# Patient Record
Sex: Female | Born: 1970 | Race: Black or African American | Hispanic: No | Marital: Single | Smoking: Never smoker
Health system: Southern US, Community
[De-identification: ages and names within clinical notes are randomized; demographics above are authoritative.]

## PROBLEM LIST (undated history)

## (undated) DIAGNOSIS — D649 Anemia, unspecified: Secondary | ICD-10-CM

## (undated) DIAGNOSIS — E876 Hypokalemia: Secondary | ICD-10-CM

## (undated) DIAGNOSIS — K449 Diaphragmatic hernia without obstruction or gangrene: Secondary | ICD-10-CM

## (undated) DIAGNOSIS — I10 Essential (primary) hypertension: Secondary | ICD-10-CM

## (undated) DIAGNOSIS — K651 Peritoneal abscess: Secondary | ICD-10-CM

## (undated) DIAGNOSIS — K5792 Diverticulitis of intestine, part unspecified, without perforation or abscess without bleeding: Secondary | ICD-10-CM

## (undated) HISTORY — DX: Diverticulitis of intestine, part unspecified, without perforation or abscess without bleeding: K57.92

## (undated) HISTORY — DX: Diaphragmatic hernia without obstruction or gangrene: K44.9

## (undated) HISTORY — DX: Hypokalemia: E87.6

## (undated) HISTORY — DX: Anemia, unspecified: D64.9

## (undated) HISTORY — DX: Peritoneal abscess: K65.1

## (undated) HISTORY — DX: Morbid (severe) obesity due to excess calories: E66.01

---

## 2001-06-04 ENCOUNTER — Encounter: Payer: Self-pay | Admitting: *Deleted

## 2001-06-04 ENCOUNTER — Inpatient Hospital Stay (HOSPITAL_COMMUNITY): Admission: AD | Admit: 2001-06-04 | Discharge: 2001-06-04 | Payer: Self-pay | Admitting: *Deleted

## 2001-06-09 ENCOUNTER — Ambulatory Visit (HOSPITAL_COMMUNITY): Admission: RE | Admit: 2001-06-09 | Discharge: 2001-06-09 | Payer: Self-pay | Admitting: Obstetrics

## 2001-06-09 ENCOUNTER — Encounter: Admission: RE | Admit: 2001-06-09 | Discharge: 2001-06-09 | Payer: Self-pay | Admitting: Obstetrics

## 2001-06-09 ENCOUNTER — Encounter: Payer: Self-pay | Admitting: Obstetrics

## 2001-06-16 ENCOUNTER — Encounter: Admission: RE | Admit: 2001-06-16 | Discharge: 2001-06-16 | Payer: Self-pay | Admitting: Obstetrics

## 2001-06-30 ENCOUNTER — Encounter: Admission: RE | Admit: 2001-06-30 | Discharge: 2001-06-30 | Payer: Self-pay | Admitting: Obstetrics

## 2001-07-07 ENCOUNTER — Encounter: Admission: RE | Admit: 2001-07-07 | Discharge: 2001-07-07 | Payer: Self-pay | Admitting: Obstetrics

## 2001-07-25 ENCOUNTER — Encounter: Payer: Self-pay | Admitting: Obstetrics

## 2001-07-25 ENCOUNTER — Inpatient Hospital Stay (HOSPITAL_COMMUNITY): Admission: AD | Admit: 2001-07-25 | Discharge: 2001-07-25 | Payer: Self-pay

## 2001-08-02 ENCOUNTER — Ambulatory Visit (HOSPITAL_COMMUNITY): Admission: RE | Admit: 2001-08-02 | Discharge: 2001-08-02 | Payer: Self-pay | Admitting: *Deleted

## 2001-08-04 ENCOUNTER — Encounter: Admission: RE | Admit: 2001-08-04 | Discharge: 2001-08-04 | Payer: Self-pay | Admitting: Obstetrics

## 2001-08-18 ENCOUNTER — Encounter: Admission: RE | Admit: 2001-08-18 | Discharge: 2001-08-18 | Payer: Self-pay | Admitting: Obstetrics

## 2001-08-25 ENCOUNTER — Encounter: Admission: RE | Admit: 2001-08-25 | Discharge: 2001-08-25 | Payer: Self-pay | Admitting: Obstetrics

## 2001-09-01 ENCOUNTER — Encounter: Admission: RE | Admit: 2001-09-01 | Discharge: 2001-09-01 | Payer: Self-pay | Admitting: Obstetrics

## 2001-09-06 ENCOUNTER — Ambulatory Visit (HOSPITAL_COMMUNITY): Admission: RE | Admit: 2001-09-06 | Discharge: 2001-09-06 | Payer: Self-pay | Admitting: Obstetrics

## 2001-09-08 ENCOUNTER — Encounter: Admission: RE | Admit: 2001-09-08 | Discharge: 2001-09-08 | Payer: Self-pay | Admitting: Obstetrics

## 2001-09-22 ENCOUNTER — Encounter: Admission: RE | Admit: 2001-09-22 | Discharge: 2001-09-22 | Payer: Self-pay | Admitting: Obstetrics

## 2001-09-28 ENCOUNTER — Encounter: Admission: RE | Admit: 2001-09-28 | Discharge: 2001-09-28 | Payer: Self-pay | Admitting: *Deleted

## 2001-10-06 ENCOUNTER — Inpatient Hospital Stay (HOSPITAL_COMMUNITY): Admission: RE | Admit: 2001-10-06 | Discharge: 2001-10-06 | Payer: Self-pay | Admitting: *Deleted

## 2001-10-06 ENCOUNTER — Encounter: Admission: RE | Admit: 2001-10-06 | Discharge: 2001-10-06 | Payer: Self-pay | Admitting: *Deleted

## 2001-10-10 ENCOUNTER — Encounter: Payer: Self-pay | Admitting: *Deleted

## 2001-10-10 ENCOUNTER — Encounter (HOSPITAL_COMMUNITY): Admission: RE | Admit: 2001-10-10 | Discharge: 2001-10-24 | Payer: Self-pay | Admitting: *Deleted

## 2001-10-12 ENCOUNTER — Inpatient Hospital Stay (HOSPITAL_COMMUNITY): Admission: AD | Admit: 2001-10-12 | Discharge: 2001-10-12 | Payer: Self-pay | Admitting: *Deleted

## 2001-10-13 ENCOUNTER — Encounter: Admission: RE | Admit: 2001-10-13 | Discharge: 2001-10-13 | Payer: Self-pay | Admitting: *Deleted

## 2001-10-17 ENCOUNTER — Encounter: Payer: Self-pay | Admitting: *Deleted

## 2001-10-24 ENCOUNTER — Inpatient Hospital Stay (HOSPITAL_COMMUNITY): Admission: AD | Admit: 2001-10-24 | Discharge: 2001-10-28 | Payer: Self-pay | Admitting: *Deleted

## 2001-11-04 ENCOUNTER — Emergency Department (HOSPITAL_COMMUNITY): Admission: EM | Admit: 2001-11-04 | Discharge: 2001-11-04 | Payer: Self-pay | Admitting: Emergency Medicine

## 2003-12-04 ENCOUNTER — Ambulatory Visit (HOSPITAL_COMMUNITY): Admission: RE | Admit: 2003-12-04 | Discharge: 2003-12-04 | Payer: Self-pay | Admitting: Obstetrics and Gynecology

## 2004-01-01 ENCOUNTER — Ambulatory Visit (HOSPITAL_COMMUNITY): Admission: RE | Admit: 2004-01-01 | Discharge: 2004-01-01 | Payer: Self-pay | Admitting: Obstetrics and Gynecology

## 2004-01-16 ENCOUNTER — Encounter: Admission: RE | Admit: 2004-01-16 | Discharge: 2004-01-16 | Payer: Self-pay | Admitting: Obstetrics and Gynecology

## 2004-01-18 ENCOUNTER — Encounter: Admission: RE | Admit: 2004-01-18 | Discharge: 2004-01-18 | Payer: Self-pay | Admitting: Obstetrics and Gynecology

## 2004-01-23 ENCOUNTER — Encounter: Admission: RE | Admit: 2004-01-23 | Discharge: 2004-01-23 | Payer: Self-pay | Admitting: Obstetrics and Gynecology

## 2004-01-25 ENCOUNTER — Encounter: Admission: RE | Admit: 2004-01-25 | Discharge: 2004-01-25 | Payer: Self-pay | Admitting: Family Medicine

## 2004-01-29 ENCOUNTER — Encounter: Admission: RE | Admit: 2004-01-29 | Discharge: 2004-01-29 | Payer: Self-pay | Admitting: *Deleted

## 2004-01-29 ENCOUNTER — Ambulatory Visit (HOSPITAL_COMMUNITY): Admission: RE | Admit: 2004-01-29 | Discharge: 2004-01-29 | Payer: Self-pay | Admitting: Obstetrics and Gynecology

## 2004-02-01 ENCOUNTER — Encounter: Admission: RE | Admit: 2004-02-01 | Discharge: 2004-02-01 | Payer: Self-pay | Admitting: Obstetrics and Gynecology

## 2004-02-05 ENCOUNTER — Ambulatory Visit (HOSPITAL_COMMUNITY): Admission: RE | Admit: 2004-02-05 | Discharge: 2004-02-05 | Payer: Self-pay | Admitting: Obstetrics and Gynecology

## 2004-02-05 ENCOUNTER — Encounter: Admission: RE | Admit: 2004-02-05 | Discharge: 2004-02-05 | Payer: Self-pay | Admitting: Obstetrics and Gynecology

## 2004-02-08 ENCOUNTER — Encounter: Admission: RE | Admit: 2004-02-08 | Discharge: 2004-02-08 | Payer: Self-pay | Admitting: Obstetrics and Gynecology

## 2004-02-11 ENCOUNTER — Encounter: Admission: RE | Admit: 2004-02-11 | Discharge: 2004-02-11 | Payer: Self-pay | Admitting: Obstetrics and Gynecology

## 2004-02-15 ENCOUNTER — Encounter: Admission: RE | Admit: 2004-02-15 | Discharge: 2004-02-15 | Payer: Self-pay | Admitting: Obstetrics and Gynecology

## 2004-02-15 ENCOUNTER — Inpatient Hospital Stay (HOSPITAL_COMMUNITY): Admission: AD | Admit: 2004-02-15 | Discharge: 2004-02-15 | Payer: Self-pay | Admitting: Obstetrics and Gynecology

## 2004-02-16 ENCOUNTER — Inpatient Hospital Stay (HOSPITAL_COMMUNITY): Admission: AD | Admit: 2004-02-16 | Discharge: 2004-02-16 | Payer: Self-pay | Admitting: Obstetrics and Gynecology

## 2004-02-21 ENCOUNTER — Encounter: Payer: Self-pay | Admitting: Obstetrics and Gynecology

## 2004-02-21 ENCOUNTER — Inpatient Hospital Stay (HOSPITAL_COMMUNITY): Admission: AD | Admit: 2004-02-21 | Discharge: 2004-03-01 | Payer: Self-pay | Admitting: Obstetrics and Gynecology

## 2010-09-14 ENCOUNTER — Encounter: Payer: Self-pay | Admitting: Obstetrics and Gynecology

## 2015-10-14 ENCOUNTER — Encounter (HOSPITAL_COMMUNITY): Payer: Self-pay | Admitting: Emergency Medicine

## 2015-10-14 DIAGNOSIS — K5720 Diverticulitis of large intestine with perforation and abscess without bleeding: Principal | ICD-10-CM | POA: Diagnosis present

## 2015-10-14 DIAGNOSIS — I10 Essential (primary) hypertension: Secondary | ICD-10-CM | POA: Diagnosis present

## 2015-10-14 DIAGNOSIS — R079 Chest pain, unspecified: Secondary | ICD-10-CM | POA: Diagnosis present

## 2015-10-14 DIAGNOSIS — D509 Iron deficiency anemia, unspecified: Secondary | ICD-10-CM | POA: Diagnosis present

## 2015-10-14 DIAGNOSIS — K449 Diaphragmatic hernia without obstruction or gangrene: Secondary | ICD-10-CM | POA: Diagnosis present

## 2015-10-14 DIAGNOSIS — E876 Hypokalemia: Secondary | ICD-10-CM | POA: Diagnosis present

## 2015-10-14 DIAGNOSIS — E669 Obesity, unspecified: Secondary | ICD-10-CM | POA: Diagnosis present

## 2015-10-14 DIAGNOSIS — Z6841 Body Mass Index (BMI) 40.0 and over, adult: Secondary | ICD-10-CM

## 2015-10-14 DIAGNOSIS — K429 Umbilical hernia without obstruction or gangrene: Secondary | ICD-10-CM | POA: Diagnosis present

## 2015-10-14 DIAGNOSIS — Z880 Allergy status to penicillin: Secondary | ICD-10-CM

## 2015-10-14 DIAGNOSIS — R319 Hematuria, unspecified: Secondary | ICD-10-CM | POA: Diagnosis present

## 2015-10-14 DIAGNOSIS — K5900 Constipation, unspecified: Secondary | ICD-10-CM | POA: Diagnosis present

## 2015-10-14 LAB — CBC
HEMATOCRIT: 28.3 % — AB (ref 36.0–46.0)
HEMOGLOBIN: 8.8 g/dL — AB (ref 12.0–15.0)
MCH: 19.9 pg — ABNORMAL LOW (ref 26.0–34.0)
MCHC: 31.1 g/dL (ref 30.0–36.0)
MCV: 64 fL — AB (ref 78.0–100.0)
Platelets: 321 10*3/uL (ref 150–400)
RBC: 4.42 MIL/uL (ref 3.87–5.11)
RDW: 21.1 % — AB (ref 11.5–15.5)
WBC: 19.6 10*3/uL — ABNORMAL HIGH (ref 4.0–10.5)

## 2015-10-14 LAB — COMPREHENSIVE METABOLIC PANEL
ALBUMIN: 3.1 g/dL — AB (ref 3.5–5.0)
ALT: 13 U/L — ABNORMAL LOW (ref 14–54)
ANION GAP: 14 (ref 5–15)
AST: 15 U/L (ref 15–41)
Alkaline Phosphatase: 88 U/L (ref 38–126)
BUN: 8 mg/dL (ref 6–20)
CHLORIDE: 99 mmol/L — AB (ref 101–111)
CO2: 22 mmol/L (ref 22–32)
Calcium: 9.3 mg/dL (ref 8.9–10.3)
Creatinine, Ser: 0.99 mg/dL (ref 0.44–1.00)
GFR calc Af Amer: >60 mL/min (ref >60)
GFR calc non Af Amer: >60 mL/min (ref >60)
GLUCOSE: 130 mg/dL — AB (ref 65–99)
POTASSIUM: 3 mmol/L — AB (ref 3.5–5.1)
SODIUM: 135 mmol/L (ref 135–145)
Total Bilirubin: 0.5 mg/dL (ref 0.3–1.2)
Total Protein: 7.5 g/dL (ref 6.5–8.1)

## 2015-10-14 LAB — LIPASE, BLOOD: LIPASE: 38 U/L (ref 11–51)

## 2015-10-14 MED ORDER — ONDANSETRON 4 MG PO TBDP
ORAL_TABLET | ORAL | Status: AC
  Filled 2015-10-14: qty 1

## 2015-10-14 MED ORDER — IBUPROFEN 400 MG PO TABS
400.0000 mg | ORAL_TABLET | Freq: Once | ORAL | Status: AC
Start: 2015-10-14 — End: 2015-10-14
  Administered 2015-10-14: 400 mg via ORAL

## 2015-10-14 MED ORDER — ONDANSETRON 4 MG PO TBDP
4.0000 mg | ORAL_TABLET | Freq: Once | ORAL | Status: AC
  Administered 2015-10-14: 4 mg via ORAL

## 2015-10-14 MED ORDER — IBUPROFEN 400 MG PO TABS
ORAL_TABLET | ORAL | Status: AC
  Filled 2015-10-14: qty 1

## 2015-10-14 NOTE — ED Notes (Signed)
Pt from home for eval of constipation x2 weeks with abd pain, pt states took a laxative on Saturday with minimal relief. Pt denies any emesis but reports some lower back pain as well. Pt in nad noted. States abd pain gets worse with movement and touch.

## 2015-10-15 ENCOUNTER — Inpatient Hospital Stay (HOSPITAL_COMMUNITY)
Admission: EM | Admit: 2015-10-15 | Discharge: 2015-10-20 | DRG: 392 | Disposition: A | Discharge status: Home / Self Care | Payer: Medicaid Other | Attending: General Surgery | Admitting: General Surgery

## 2015-10-15 ENCOUNTER — Emergency Department (HOSPITAL_COMMUNITY): Payer: Medicaid Other

## 2015-10-15 ENCOUNTER — Encounter (HOSPITAL_COMMUNITY): Payer: Self-pay | Admitting: Radiology

## 2015-10-15 DIAGNOSIS — E669 Obesity, unspecified: Secondary | ICD-10-CM | POA: Diagnosis not present

## 2015-10-15 DIAGNOSIS — K5900 Constipation, unspecified: Secondary | ICD-10-CM | POA: Diagnosis present

## 2015-10-15 DIAGNOSIS — D649 Anemia, unspecified: Secondary | ICD-10-CM | POA: Diagnosis present

## 2015-10-15 DIAGNOSIS — K5720 Diverticulitis of large intestine with perforation and abscess without bleeding: Secondary | ICD-10-CM | POA: Diagnosis not present

## 2015-10-15 DIAGNOSIS — I10 Essential (primary) hypertension: Secondary | ICD-10-CM | POA: Diagnosis present

## 2015-10-15 DIAGNOSIS — Z880 Allergy status to penicillin: Secondary | ICD-10-CM | POA: Diagnosis not present

## 2015-10-15 DIAGNOSIS — E876 Hypokalemia: Secondary | ICD-10-CM | POA: Diagnosis present

## 2015-10-15 DIAGNOSIS — K429 Umbilical hernia without obstruction or gangrene: Secondary | ICD-10-CM | POA: Diagnosis present

## 2015-10-15 DIAGNOSIS — D509 Iron deficiency anemia, unspecified: Secondary | ICD-10-CM | POA: Diagnosis not present

## 2015-10-15 DIAGNOSIS — K449 Diaphragmatic hernia without obstruction or gangrene: Secondary | ICD-10-CM | POA: Diagnosis present

## 2015-10-15 DIAGNOSIS — R079 Chest pain, unspecified: Secondary | ICD-10-CM | POA: Diagnosis not present

## 2015-10-15 DIAGNOSIS — R319 Hematuria, unspecified: Secondary | ICD-10-CM | POA: Diagnosis present

## 2015-10-15 DIAGNOSIS — Z6841 Body Mass Index (BMI) 40.0 and over, adult: Secondary | ICD-10-CM | POA: Diagnosis not present

## 2015-10-15 DIAGNOSIS — K5732 Diverticulitis of large intestine without perforation or abscess without bleeding: Secondary | ICD-10-CM | POA: Diagnosis present

## 2015-10-15 HISTORY — DX: Essential (primary) hypertension: I10

## 2015-10-15 LAB — URINALYSIS, ROUTINE W REFLEX MICROSCOPIC
BILIRUBIN URINE: NEGATIVE
Glucose, UA: NEGATIVE mg/dL
KETONES UR: NEGATIVE mg/dL
Leukocytes, UA: NEGATIVE
NITRITE: NEGATIVE
PH: 6 (ref 5.0–8.0)
Protein, ur: NEGATIVE mg/dL
SPECIFIC GRAVITY, URINE: 1.017 (ref 1.005–1.030)

## 2015-10-15 LAB — URINE MICROSCOPIC-ADD ON: WBC, UA: NONE SEEN WBC/hpf (ref 0–5)

## 2015-10-15 LAB — PROTIME-INR
INR: 1.36 (ref 0.00–1.49)
Prothrombin Time: 16.9 seconds — ABNORMAL HIGH (ref 11.6–15.2)

## 2015-10-15 LAB — APTT: aPTT: 35 seconds (ref 24–37)

## 2015-10-15 LAB — POC URINE PREG, ED: Preg Test, Ur: NEGATIVE

## 2015-10-15 MED ORDER — MORPHINE SULFATE (PF) 4 MG/ML IV SOLN
4.0000 mg | Freq: Once | INTRAVENOUS | Status: AC
  Administered 2015-10-15: 4 mg via INTRAVENOUS
  Filled 2015-10-15: qty 1

## 2015-10-15 MED ORDER — ACETAMINOPHEN 325 MG PO TABS
650.0000 mg | ORAL_TABLET | Freq: Four times a day (QID) | ORAL | Status: DC | PRN
  Administered 2015-10-18: 650 mg via ORAL
  Filled 2015-10-15: qty 2

## 2015-10-15 MED ORDER — LABETALOL HCL 100 MG PO TABS
200.0000 mg | ORAL_TABLET | Freq: Two times a day (BID) | ORAL | Status: DC
  Administered 2015-10-15 – 2015-10-16 (×4): 200 mg via ORAL
  Filled 2015-10-15: qty 2
  Filled 2015-10-15 (×2): qty 1
  Filled 2015-10-15 (×2): qty 2
  Filled 2015-10-15: qty 1
  Filled 2015-10-15: qty 2

## 2015-10-15 MED ORDER — PIPERACILLIN-TAZOBACTAM 3.375 G IVPB
3.3750 g | Freq: Three times a day (TID) | INTRAVENOUS | Status: DC
  Administered 2015-10-15 – 2015-10-17 (×7): 3.375 g via INTRAVENOUS
  Filled 2015-10-15 (×8): qty 50

## 2015-10-15 MED ORDER — SODIUM CHLORIDE 0.9 % IV SOLN
1000.0000 mL | Freq: Once | INTRAVENOUS | Status: AC
  Administered 2015-10-15: 1000 mL via INTRAVENOUS

## 2015-10-15 MED ORDER — ACETAMINOPHEN 650 MG RE SUPP: 650.0000 mg | Freq: Four times a day (QID) | RECTAL | Status: DC | PRN

## 2015-10-15 MED ORDER — CIPROFLOXACIN IN D5W 400 MG/200ML IV SOLN: 400.0000 mg | Freq: Two times a day (BID) | INTRAVENOUS | Status: DC

## 2015-10-15 MED ORDER — CHLORHEXIDINE GLUCONATE 0.12 % MT SOLN
15.0000 mL | Freq: Two times a day (BID) | OROMUCOSAL | Status: DC
  Administered 2015-10-15 – 2015-10-17 (×5): 15 mL via OROMUCOSAL
  Filled 2015-10-15 (×5): qty 15

## 2015-10-15 MED ORDER — CIPROFLOXACIN IN D5W 400 MG/200ML IV SOLN
400.0000 mg | Freq: Once | INTRAVENOUS | Status: AC
  Administered 2015-10-15: 400 mg via INTRAVENOUS
  Filled 2015-10-15: qty 200

## 2015-10-15 MED ORDER — ONDANSETRON HCL 4 MG/2ML IJ SOLN
4.0000 mg | Freq: Once | INTRAMUSCULAR | Status: AC
  Administered 2015-10-15: 4 mg via INTRAVENOUS
  Filled 2015-10-15: qty 2

## 2015-10-15 MED ORDER — SODIUM CHLORIDE 0.9 % IV SOLN
1000.0000 mL | INTRAVENOUS | Status: DC
  Administered 2015-10-15 – 2015-10-16 (×2): 1000 mL via INTRAVENOUS

## 2015-10-15 MED ORDER — DIPHENHYDRAMINE HCL 50 MG/ML IJ SOLN: 25.0000 mg | Freq: Four times a day (QID) | INTRAMUSCULAR | Status: DC | PRN

## 2015-10-15 MED ORDER — HYDROMORPHONE HCL 1 MG/ML IJ SOLN
0.5000 mg | INTRAMUSCULAR | Status: DC | PRN
  Administered 2015-10-15 – 2015-10-16 (×4): 1 mg via INTRAVENOUS
  Filled 2015-10-15 (×4): qty 1

## 2015-10-15 MED ORDER — POTASSIUM CHLORIDE IN NACL 40-0.9 MEQ/L-% IV SOLN
INTRAVENOUS | Status: DC
  Administered 2015-10-15 – 2015-10-18 (×4): 100 mL/h via INTRAVENOUS
  Administered 2015-10-18 – 2015-10-20 (×3): 50 mL/h via INTRAVENOUS
  Filled 2015-10-15 (×14): qty 1000

## 2015-10-15 MED ORDER — ONDANSETRON 4 MG PO TBDP: 4.0000 mg | ORAL_TABLET | Freq: Four times a day (QID) | ORAL | Status: DC | PRN

## 2015-10-15 MED ORDER — POTASSIUM CHLORIDE 10 MEQ/100ML IV SOLN
10.0000 meq | Freq: Once | INTRAVENOUS | Status: AC
  Administered 2015-10-15: 10 meq via INTRAVENOUS
  Filled 2015-10-15: qty 100

## 2015-10-15 MED ORDER — CETYLPYRIDINIUM CHLORIDE 0.05 % MT LIQD
7.0000 mL | Freq: Two times a day (BID) | OROMUCOSAL | Status: DC
  Administered 2015-10-16 (×2): 7 mL via OROMUCOSAL

## 2015-10-15 MED ORDER — HYDRALAZINE HCL 20 MG/ML IJ SOLN
10.0000 mg | INTRAMUSCULAR | Status: DC | PRN
  Administered 2015-10-15 – 2015-10-16 (×2): 10 mg via INTRAVENOUS
  Filled 2015-10-15 (×2): qty 1

## 2015-10-15 MED ORDER — DIPHENHYDRAMINE HCL 25 MG PO CAPS: 25.0000 mg | ORAL_CAPSULE | Freq: Four times a day (QID) | ORAL | Status: DC | PRN

## 2015-10-15 MED ORDER — ONDANSETRON HCL 4 MG/2ML IJ SOLN
4.0000 mg | Freq: Four times a day (QID) | INTRAMUSCULAR | Status: DC | PRN
  Administered 2015-10-15 – 2015-10-17 (×3): 4 mg via INTRAVENOUS
  Filled 2015-10-15 (×3): qty 2

## 2015-10-15 MED ORDER — IOHEXOL 300 MG/ML  SOLN
100.0000 mL | Freq: Once | INTRAMUSCULAR | Status: AC | PRN
  Administered 2015-10-15: 100 mL via INTRAVENOUS

## 2015-10-15 MED ORDER — METRONIDAZOLE IN NACL 5-0.79 MG/ML-% IV SOLN: 500.0000 mg | Freq: Three times a day (TID) | INTRAVENOUS | Status: DC

## 2015-10-15 MED ORDER — OXYCODONE-ACETAMINOPHEN 5-325 MG PO TABS
1.0000 | ORAL_TABLET | ORAL | Status: DC | PRN
  Administered 2015-10-16 – 2015-10-17 (×3): 2 via ORAL
  Filled 2015-10-15 (×5): qty 2

## 2015-10-15 MED ORDER — METRONIDAZOLE IN NACL 5-0.79 MG/ML-% IV SOLN
500.0000 mg | Freq: Once | INTRAVENOUS | Status: AC
  Administered 2015-10-15: 500 mg via INTRAVENOUS
  Filled 2015-10-15: qty 100

## 2015-10-15 MED ORDER — ENOXAPARIN SODIUM 40 MG/0.4ML ~~LOC~~ SOLN
40.0000 mg | SUBCUTANEOUS | Status: DC
  Administered 2015-10-15 – 2015-10-19 (×5): 40 mg via SUBCUTANEOUS
  Filled 2015-10-15 (×7): qty 0.4

## 2015-10-15 NOTE — Progress Notes (Signed)
Patient ID: Bethany Houston, female   DOB: October 24, 1970, 45 y.o.   MRN: [id number]   Abdominal collection drain placement requested   Dr Loreta Ave has reviewed imaging and feels NO safe window for drain placement  Rec: re scan in several days after treatment---if abscess still apparent---may have window at that time  Will discuss with [facility]

## 2015-10-15 NOTE — ED Notes (Signed)
Dr. Glick at bedside.  

## 2015-10-15 NOTE — ED Notes (Signed)
Surgery at the bedside.

## 2015-10-15 NOTE — H&P (Signed)
Bethany Houston is an 45 y.o. female.   PCP: No primary care provider on file.  Chief Complaint:  Abdominal pain and constipation HPI: Pt presents with constipation that started last week, and then abdominal pain.  Laxative gave some relief, but 3 days ago had pain returned.  This was across her lower abdomen, and goes to her back.  Worse standing and pressure lower abdomen when lying down.  She has also had some nausea and vomiting on 2/19 & 2/20.  Pain was not getting better said it was like labor pain coming and going with no resolution.  Some relief after BM's.  She normally does not have issue with constipation.  Work up in the ED shows: admit temp 100.4, VSS.K+ 3.0, Glucose 130, WBC 19.6, and anemia with H/H 89.8/28, hematuria, with hx of recent heavy periods.  CT scan shows:  7.6 x 3.2 x 5.6 cm collection of fluid and air superior to the mid sigmoid colon, at the lower abdomen. Mild associated peripheral enhancement, compatible with evolving mesenteric abscess. This appears to reflect contained perforated diverticulitis, given underlying colonic wall thickening and diverticula. Given the location of this collection, it would be difficult to drain Percutaneously. Soft tissue inflammation seen extending superiorly along the mesentery. Tiny umbilical hernia, containing only fat.  Minimal scattered diverticulosis along the transverse and descending colon.  Tiny hiatal hernia noted.  We are ask to see.  Past Medical History  Diagnosis Date  . Hypertension     History reviewed. No pertinent past surgical history.  No family history on file. Social History:  reports that she has never smoked. She does not have any smokeless tobacco history on file. She reports that she does not drink alcohol. Her drug history is not on file.  Allergies:  Allergies  Allergen Reactions  . Penicillins Nausea And Vomiting    Prior to Admission medications   Medication Sig Start Date End Date Taking?  Authorizing Provider  labetalol (NORMODYNE) 200 MG tablet Take 200 mg by mouth 2 (two) times daily.   Yes Historical Provider, MD     Results for orders placed or performed during the hospital encounter of 10/15/15 (from the past 48 hour(s))  Lipase, blood     Status: None   Collection Time: 10/14/15  9:04 PM  Result Value Ref Range   Lipase 38 11 - 51 U/L  Comprehensive metabolic panel     Status: Abnormal   Collection Time: 10/14/15  9:04 PM  Result Value Ref Range   Sodium 135 135 - 145 mmol/L   Potassium 3.0 (L) 3.5 - 5.1 mmol/L   Chloride 99 (L) 101 - 111 mmol/L   CO2 22 22 - 32 mmol/L   Glucose, Bld 130 (H) 65 - 99 mg/dL   BUN 8 6 - 20 mg/dL   Creatinine, Ser 0.99 0.44 - 1.00 mg/dL   Calcium 9.3 8.9 - 10.3 mg/dL   Total Protein 7.5 6.5 - 8.1 g/dL   Albumin 3.1 (L) 3.5 - 5.0 g/dL   AST 15 15 - 41 U/L   ALT 13 (L) 14 - 54 U/L   Alkaline Phosphatase 88 38 - 126 U/L   Total Bilirubin 0.5 0.3 - 1.2 mg/dL   GFR calc non Af Amer >60 >60 mL/min   GFR calc Af Amer >60 >60 mL/min    Comment: (NOTE) The eGFR has been calculated using the CKD EPI equation. This calculation has not been validated in all clinical situations. eGFR's persistently <60  mL/min signify possible Chronic Kidney Disease.    Anion gap 14 5 - 15  CBC     Status: Abnormal   Collection Time: 10/14/15  9:04 PM  Result Value Ref Range   WBC 19.6 (H) 4.0 - 10.5 K/uL   RBC 4.42 3.87 - 5.11 MIL/uL   Hemoglobin 8.8 (L) 12.0 - 15.0 g/dL   HCT 28.3 (L) 36.0 - 46.0 %   MCV 64.0 (L) 78.0 - 100.0 fL   MCH 19.9 (L) 26.0 - 34.0 pg   MCHC 31.1 30.0 - 36.0 g/dL   RDW 21.1 (H) 11.5 - 15.5 %   Platelets 321 150 - 400 K/uL  Urinalysis, Routine w reflex microscopic (not at [facility])     Status: Abnormal   Collection Time: 10/15/15  4:35 AM  Result Value Ref Range   Color, Urine AMBER (A) YELLOW    Comment: BIOCHEMICALS MAY BE AFFECTED BY COLOR   APPearance CLOUDY (A) CLEAR   Specific Gravity, Urine 1.017 1.005 - 1.030    pH 6.0 5.0 - 8.0   Glucose, UA NEGATIVE NEGATIVE mg/dL   Hgb urine dipstick LARGE (A) NEGATIVE   Bilirubin Urine NEGATIVE NEGATIVE   Ketones, ur NEGATIVE NEGATIVE mg/dL   Protein, ur NEGATIVE NEGATIVE mg/dL   Nitrite NEGATIVE NEGATIVE   Leukocytes, UA NEGATIVE NEGATIVE  Urine microscopic-add on     Status: Abnormal   Collection Time: 10/15/15  4:35 AM  Result Value Ref Range   Squamous Epithelial / LPF 6-30 (A) NONE SEEN   WBC, UA NONE SEEN 0 - 5 WBC/hpf   RBC / HPF TOO NUMEROUS TO COUNT 0 - 5 RBC/hpf   Bacteria, UA FEW (A) NONE SEEN  POC urine preg, ED     Status: None   Collection Time: 10/15/15  4:49 AM  Result Value Ref Range   Preg Test, Ur NEGATIVE NEGATIVE    Comment:        THE SENSITIVITY OF THIS METHODOLOGY IS >24 mIU/mL    Ct Abdomen Pelvis W Contrast  10/15/2015  CLINICAL DATA:  Subacute onset of generalized abdominal pain and constipation. Lower back pain. Initial encounter. EXAM: CT ABDOMEN AND PELVIS WITH CONTRAST TECHNIQUE: Multidetector CT imaging of the abdomen and pelvis was performed using the standard protocol following bolus administration of intravenous contrast. CONTRAST:  163m OMNIPAQUE IOHEXOL 300 MG/ML  SOLN COMPARISON:  None. FINDINGS: The visualized lung bases are clear.  A tiny hiatal hernia is noted. The liver and spleen are unremarkable in appearance. The gallbladder is within normal limits. The pancreas and adrenal glands are unremarkable. The kidneys are unremarkable in appearance. There is no evidence of hydronephrosis. No renal or ureteral stones are seen. No perinephric stranding is appreciated. The small bowel is unremarkable in appearance. The stomach is within normal limits. No acute vascular abnormalities are seen. There is a 7.6 x 3.2 x 5.6 cm collection of fluid and air noted superior to the mid sigmoid colon, at the lower abdomen. Mild associated peripheral enhancement is seen, compatible with evolving abscess. This appears to be contained within  the mesentery. There is associated wall thickening along the mid sigmoid colon, with likely underlying diverticula. Findings are suspicious for contained perforated diverticulitis. Given the location of this collection, it would be difficult to drain percutaneously. Soft tissue inflammation is seen extending superiorly along the mesentery. The appendix is normal in caliber, without evidence of appendicitis. Minimal scattered diverticulosis is noted along the transverse and descending colon. A tiny umbilical hernia  is noted, containing only fat. The bladder is mildly distended and grossly remarkable. The uterus is grossly unremarkable in appearance. The ovaries are relatively symmetric. No suspicious adnexal masses are seen. No inguinal lymphadenopathy is seen. No acute osseous abnormalities are identified. IMPRESSION: 1. 7.6 x 3.2 x 5.6 cm collection of fluid and air superior to the mid sigmoid colon, at the lower abdomen. Mild associated peripheral enhancement, compatible with evolving mesenteric abscess. This appears to reflect contained perforated diverticulitis, given underlying colonic wall thickening and diverticula. Given the location of this collection, it would be difficult to drain percutaneously. 2. Soft tissue inflammation seen extending superiorly along the mesentery. 3. Tiny umbilical hernia, containing only fat. 4. Minimal scattered diverticulosis along the transverse and descending colon. 5. Tiny hiatal hernia noted. These results were called by telephone at the time of interpretation on 10/15/2015 at 6:24 am to Dr. Delora Fuel, who verbally acknowledged these results. Electronically Signed   By: Garald Balding M.D.   On: 10/15/2015 06:30    Review of Systems  Constitutional: Negative.   HENT: Negative.   Eyes: Negative.   Respiratory: Negative.   Cardiovascular: Negative.   Gastrointestinal: Positive for heartburn, nausea, vomiting, abdominal pain and constipation. Negative for diarrhea,  blood in stool and melena.  Genitourinary: Negative.        She is going thru Menopause, with no period since 05/2015.  Musculoskeletal: Negative.   Skin: Negative.   Neurological: Negative.   Endo/Heme/Allergies: Negative.   Psychiatric/Behavioral: Negative.     Blood pressure 153/84, pulse 82, temperature 98.4 F (36.9 C), temperature source Oral, resp. rate 17, SpO2 98 %. Physical Exam  Constitutional: She is oriented to person, place, and time. She appears well-developed and well-nourished. No distress.  66 inch Wt 394 BMI 63.6  HENT:  Head: Normocephalic and atraumatic.  Nose: Nose normal.  Eyes: Conjunctivae and EOM are normal. Right eye exhibits no discharge. Left eye exhibits no discharge. No scleral icterus.  Neck: Normal range of motion. Neck supple. No JVD present. No tracheal deviation present. No thyromegaly present.  Cardiovascular: Normal rate, regular rhythm, normal heart sounds and intact distal pulses.   No murmur heard. Respiratory: Effort normal and breath sounds normal. No respiratory distress. She has no wheezes. She has no rales. She exhibits no tenderness.  GI: Soft. Bowel sounds are normal. She exhibits no distension and no mass. There is tenderness (LLQ). There is no rebound and no guarding.  Musculoskeletal: She exhibits no edema.  Lymphadenopathy:    She has no cervical adenopathy.  Neurological: She is alert and oriented to person, place, and time. No cranial nerve deficit.  Skin: Skin is warm and dry. No rash noted. She is not diaphoretic. No erythema. No pallor.  Psychiatric: She has a normal mood and affect. Her behavior is normal. Judgment and thought content normal.     Assessment/Plan Acute sigmoid diverticulitis with  7.6 x 3.2 x 5.6 cm abscess Hypertension  BMI 63.3 Hypokalemia Anemia   Plan:  Admit, ask IR to review and be sure they cannot drain abscess, start her on Cipro/Flagyl secondary to penicillin allergy, hydrate, bowel rest.  I  will keep her on her BP medicine and see how she progresses.  Galaxy Borden, PA-C 10/15/2015, 7:41 AM

## 2015-10-15 NOTE — ED Notes (Signed)
Patient transported to CT 

## 2015-10-15 NOTE — ED Provider Notes (Signed)
CSN: [id number]     Arrival date & time 10/14/15  1953 History   First MD Initiated Contact with Patient 10/15/15 0410     Chief Complaint  Patient presents with  . Constipation  . Abdominal Pain     (Consider location/radiation/quality/duration/timing/severity/associated sxs/prior Treatment) Patient is a 45 y.o. female presenting with constipation and abdominal pain. The history is provided by the patient.  Constipation Associated symptoms: abdominal pain   Abdominal Pain Associated symptoms: constipation   She complains feeling of constipated for about the last week. She started having some epigastric pain about a week ago and thought she was constipated and took a laxative and had a bowel movement with resolution of pain. She was doing reasonably well until about 3 days ago when she had recurrence of pain. This time pain was across lower abdomen. She did take a laxative which gave slight relief but pain never went away. Pain does radiate to the back. When she stands, pain is worse in her back. When she lays down, she feels pressure across her lower abdomen. She does a better after passing some flatus. She has had nausea and vomiting and does feel better after vomiting. She denies fever or chills or sweats. She does have history of abdominal surgery with cesarean section about 10 years ago. She states that she has not had a menses in about 3 months and thinks she is going through menopause.  Past Medical History  Diagnosis Date  . Hypertension    History reviewed. No pertinent past surgical history. No family history on file. Social History  Substance Use Topics  . Smoking status: Never Smoker   . Smokeless tobacco: None  . Alcohol Use: No   OB History    No data available     Review of Systems  Gastrointestinal: Positive for abdominal pain and constipation.  All other systems reviewed and are negative.     Allergies  Penicillins  Home Medications   Prior to Admission  medications   Medication Sig Start Date End Date Taking? Authorizing Provider  labetalol (NORMODYNE) 200 MG tablet Take 200 mg by mouth 2 (two) times daily.   Yes Historical Provider, MD   BP 155/84 mmHg  Pulse 81  Temp(Src) 98.4 F (36.9 C) (Oral)  Resp 16  SpO2 98% Physical Exam  Nursing note and vitals reviewed.  Obese 45 year old female, resting comfortably and in no acute distress. Vital signs are significant for hypertension. Oxygen saturation is 98%, which is normal. Head is normocephalic and atraumatic. PERRLA, EOMI. Oropharynx is clear. Neck is nontender and supple without adenopathy or JVD. Back is nontender and there is no CVA tenderness. Lungs are clear without rales, wheezes, or rhonchi. Chest is nontender. Heart has regular rate and rhythm without murmur. Abdomen is soft, flat, with moderate tenderness across the lower abdomen. There is no rebound or guarding. There are no masses or hepatosplenomegaly and peristalsis is hypoactive. Extremities have no cyanosis or edema, full range of motion is present. Skin is warm and dry without rash. Neurologic: Mental status is normal, cranial nerves are intact, there are no motor or sensory deficits.  ED Course  Procedures (including critical care time) Labs Review Results for orders placed or performed during the hospital encounter of 10/15/15  Lipase, blood  Result Value Ref Range   Lipase 38 11 - 51 U/L  Comprehensive metabolic panel  Result Value Ref Range   Sodium 135 135 - 145 mmol/L   Potassium 3.0 (L)  3.5 - 5.1 mmol/L   Chloride 99 (L) 101 - 111 mmol/L   CO2 22 22 - 32 mmol/L   Glucose, Bld 130 (H) 65 - 99 mg/dL   BUN 8 6 - 20 mg/dL   Creatinine, Ser 1.61 0.44 - 1.00 mg/dL   Calcium 9.3 8.9 - 09.6 mg/dL   Total Protein 7.5 6.5 - 8.1 g/dL   Albumin 3.1 (L) 3.5 - 5.0 g/dL   AST 15 15 - 41 U/L   ALT 13 (L) 14 - 54 U/L   Alkaline Phosphatase 88 38 - 126 U/L   Total Bilirubin 0.5 0.3 - 1.2 mg/dL   GFR calc non Af  Amer >60 >60 mL/min   GFR calc Af Amer >60 >60 mL/min   Anion gap 14 5 - 15  CBC  Result Value Ref Range   WBC 19.6 (H) 4.0 - 10.5 K/uL   RBC 4.42 3.87 - 5.11 MIL/uL   Hemoglobin 8.8 (L) 12.0 - 15.0 g/dL   HCT 04.5 (L) 40.9 - 81.1 %   MCV 64.0 (L) 78.0 - 100.0 fL   MCH 19.9 (L) 26.0 - 34.0 pg   MCHC 31.1 30.0 - 36.0 g/dL   RDW 91.4 (H) 78.2 - 95.6 %   Platelets 321 150 - 400 K/uL  Urinalysis, Routine w reflex microscopic (not at [facility])  Result Value Ref Range   Color, Urine AMBER (A) YELLOW   APPearance CLOUDY (A) CLEAR   Specific Gravity, Urine 1.017 1.005 - 1.030   pH 6.0 5.0 - 8.0   Glucose, UA NEGATIVE NEGATIVE mg/dL   Hgb urine dipstick LARGE (A) NEGATIVE   Bilirubin Urine NEGATIVE NEGATIVE   Ketones, ur NEGATIVE NEGATIVE mg/dL   Protein, ur NEGATIVE NEGATIVE mg/dL   Nitrite NEGATIVE NEGATIVE   Leukocytes, UA NEGATIVE NEGATIVE  Urine microscopic-add on  Result Value Ref Range   Squamous Epithelial / LPF 6-30 (A) NONE SEEN   WBC, UA NONE SEEN 0 - 5 WBC/hpf   RBC / HPF TOO NUMEROUS TO COUNT 0 - 5 RBC/hpf   Bacteria, UA FEW (A) NONE SEEN  POC urine preg, ED  Result Value Ref Range   Preg Test, Ur NEGATIVE NEGATIVE   Imaging Review Ct Abdomen Pelvis W Contrast  10/15/2015  CLINICAL DATA:  Subacute onset of generalized abdominal pain and constipation. Lower back pain. Initial encounter. EXAM: CT ABDOMEN AND PELVIS WITH CONTRAST TECHNIQUE: Multidetector CT imaging of the abdomen and pelvis was performed using the standard protocol following bolus administration of intravenous contrast. CONTRAST:  OMNIPAQUE IOHEXOL 300 MG/ML  SOLN COMPARISON:  None. FINDINGS: The visualized lung bases are clear.  A tiny hiatal hernia is noted. The liver and spleen are unremarkable in appearance. The gallbladder is within normal limits. The pancreas and adrenal glands are unremarkable. The kidneys are unremarkable in appearance. There is no evidence of hydronephrosis. No renal or ureteral  stones are seen. No perinephric stranding is appreciated. The small bowel is unremarkable in appearance. The stomach is within normal limits. No acute vascular abnormalities are seen. There is a 7.6 x 3.2 x 5.6 cm collection of fluid and air noted superior to the mid sigmoid colon, at the lower abdomen. Mild associated peripheral enhancement is seen, compatible with evolving abscess. This appears to be contained within the mesentery. There is associated wall thickening along the mid sigmoid colon, with likely underlying diverticula. Findings are suspicious for contained perforated diverticulitis. Given the location of this collection, it would be difficult to  drain percutaneously. Soft tissue inflammation is seen extending superiorly along the mesentery. The appendix is normal in caliber, without evidence of appendicitis. Minimal scattered diverticulosis is noted along the transverse and descending colon. A tiny umbilical hernia is noted, containing only fat. The bladder is mildly distended and grossly remarkable. The uterus is grossly unremarkable in appearance. The ovaries are relatively symmetric. No suspicious adnexal masses are seen. No inguinal lymphadenopathy is seen. No acute osseous abnormalities are identified. IMPRESSION: 1. 7.6 x 3.2 x 5.6 cm collection of fluid and air superior to the mid sigmoid colon, at the lower abdomen. Mild associated peripheral enhancement, compatible with evolving mesenteric abscess. This appears to reflect contained perforated diverticulitis, given underlying colonic wall thickening and diverticula. Given the location of this collection, it would be difficult to drain percutaneously. 2. Soft tissue inflammation seen extending superiorly along the mesentery. 3. Tiny umbilical hernia, containing only fat. 4. Minimal scattered diverticulosis along the transverse and descending colon. 5. Tiny hiatal hernia noted. These results were called by telephone at the time of interpretation  on 10/15/2015 at 6:24 am to Dr. Dione Booze, who verbally acknowledged these results. Electronically Signed   By: Roanna Raider M.D.   On: 10/15/2015 06:30   I have personally reviewed and evaluated these images and lab results as part of my medical decision-making. I have personally discussed the findings with radiologist.   MDM   Final diagnoses:  Diverticulitis of large intestine with perforation and abscess without bleeding  Hypokalemia  Microcytic hypochromic anemia    Abdominal pain with subjective constipation. Significant elevation in WBC is noted. I'm concerned about possibility of diverticulitis and also small bowel obstruction. She will be sent for CT of abdomen and pelvis. Old records are reviewed confirming hospitalization for cesarean section.  CT scan shows perforated diverticulitis with abscess. She started on ciprofloxacin and metronidazole. With diverticular abscess, I discussed with the radiologist whether this would be amenable to percutaneous drainage and he felt it would be very difficult to approach percutaneously. Consultation will be obtained with internal medicine and general surgery to arrange hospital admission. Case is discussed with Dr. Janee Morn of general surgery agrees to admit the patient. Also, discussed with Willette Cluster of [facility] who agrees to see the patient in consultation.  Dione Booze, MD 10/15/15 [phone]

## 2015-10-15 NOTE — Progress Notes (Signed)
Dr. Dwain Sarna notified of patient's BP 194/99

## 2015-10-15 NOTE — Progress Notes (Signed)
Utilization Review completed.  Quade Ramirez RN CM  

## 2015-10-16 ENCOUNTER — Other Ambulatory Visit: Payer: Self-pay

## 2015-10-16 ENCOUNTER — Encounter (HOSPITAL_COMMUNITY): Payer: Self-pay | Admitting: Internal Medicine

## 2015-10-16 DIAGNOSIS — R079 Chest pain, unspecified: Secondary | ICD-10-CM | POA: Diagnosis not present

## 2015-10-16 DIAGNOSIS — K5720 Diverticulitis of large intestine with perforation and abscess without bleeding: Principal | ICD-10-CM

## 2015-10-16 DIAGNOSIS — D649 Anemia, unspecified: Secondary | ICD-10-CM | POA: Diagnosis present

## 2015-10-16 DIAGNOSIS — E876 Hypokalemia: Secondary | ICD-10-CM

## 2015-10-16 DIAGNOSIS — D509 Iron deficiency anemia, unspecified: Secondary | ICD-10-CM | POA: Insufficient documentation

## 2015-10-16 DIAGNOSIS — E669 Obesity, unspecified: Secondary | ICD-10-CM | POA: Diagnosis present

## 2015-10-16 DIAGNOSIS — I10 Essential (primary) hypertension: Secondary | ICD-10-CM | POA: Diagnosis present

## 2015-10-16 LAB — BASIC METABOLIC PANEL
Anion gap: 12 (ref 5–15)
BUN: 5 mg/dL — AB (ref 6–20)
CALCIUM: 8.7 mg/dL — AB (ref 8.9–10.3)
CHLORIDE: 106 mmol/L (ref 101–111)
CO2: 22 mmol/L (ref 22–32)
CREATININE: 0.89 mg/dL (ref 0.44–1.00)
GFR calc non Af Amer: >60 mL/min (ref >60)
Glucose, Bld: 105 mg/dL — ABNORMAL HIGH (ref 65–99)
Potassium: 3.5 mmol/L (ref 3.5–5.1)
Sodium: 140 mmol/L (ref 135–145)

## 2015-10-16 LAB — IRON AND TIBC
IRON: 18 ug/dL — AB (ref 28–170)
Saturation Ratios: 6 % — ABNORMAL LOW (ref 10.4–31.8)
TIBC: 302 ug/dL (ref 250–450)
UIBC: 284 ug/dL

## 2015-10-16 LAB — TROPONIN I: Troponin I: <0.03 ng/mL (ref <0.031)

## 2015-10-16 LAB — CBC
HCT: 26.4 % — ABNORMAL LOW (ref 36.0–46.0)
Hemoglobin: 8.4 g/dL — ABNORMAL LOW (ref 12.0–15.0)
MCH: 20.8 pg — AB (ref 26.0–34.0)
MCHC: 31.8 g/dL (ref 30.0–36.0)
MCV: 65.5 fL — AB (ref 78.0–100.0)
PLATELETS: 322 10*3/uL (ref 150–400)
RBC: 4.03 MIL/uL (ref 3.87–5.11)
RDW: 21.6 % — AB (ref 11.5–15.5)
WBC: 13.4 10*3/uL — ABNORMAL HIGH (ref 4.0–10.5)

## 2015-10-16 LAB — RETICULOCYTES
RBC.: 4.6 MIL/uL (ref 3.87–5.11)
RETIC COUNT ABSOLUTE: 27.6 10*3/uL (ref 19.0–186.0)
Retic Ct Pct: 0.6 % (ref 0.4–3.1)

## 2015-10-16 LAB — MAGNESIUM: MAGNESIUM: 2 mg/dL (ref 1.7–2.4)

## 2015-10-16 LAB — LIPID PANEL
Cholesterol: 159 mg/dL (ref 0–200)
HDL: 35 mg/dL — ABNORMAL LOW (ref >40)
LDL CALC: 106 mg/dL — AB (ref 0–99)
TRIGLYCERIDES: 88 mg/dL (ref <150)
Total CHOL/HDL Ratio: 4.5 RATIO
VLDL: 18 mg/dL (ref 0–40)

## 2015-10-16 LAB — VITAMIN B12: VITAMIN B 12: 584 pg/mL (ref 180–914)

## 2015-10-16 LAB — FOLATE: FOLATE: 22.1 ng/mL (ref >5.9)

## 2015-10-16 LAB — FERRITIN: Ferritin: 56 ng/mL (ref 11–307)

## 2015-10-16 MED ORDER — PROMETHAZINE HCL 25 MG/ML IJ SOLN
6.2500 mg | Freq: Four times a day (QID) | INTRAMUSCULAR | Status: DC | PRN
  Administered 2015-10-16 – 2015-10-17 (×3): 12.5 mg via INTRAVENOUS
  Filled 2015-10-16 (×4): qty 1

## 2015-10-16 MED ORDER — PROMETHAZINE HCL 25 MG/ML IJ SOLN: 25.0000 mg | Freq: Four times a day (QID) | INTRAMUSCULAR | Status: DC | PRN

## 2015-10-16 MED ORDER — HYDRALAZINE HCL 20 MG/ML IJ SOLN: 5.0000 mg | Freq: Four times a day (QID) | INTRAMUSCULAR | Status: DC | PRN

## 2015-10-16 MED ORDER — ALUM & MAG HYDROXIDE-SIMETH 200-200-20 MG/5ML PO SUSP
30.0000 mL | Freq: Once | ORAL | Status: DC
  Filled 2015-10-16 (×2): qty 30

## 2015-10-16 MED ORDER — GI COCKTAIL ~~LOC~~
30.0000 mL | Freq: Two times a day (BID) | ORAL | Status: DC | PRN
  Administered 2015-10-16: 30 mL via ORAL
  Filled 2015-10-16 (×2): qty 30

## 2015-10-16 MED ORDER — HYDRALAZINE HCL 20 MG/ML IJ SOLN
10.0000 mg | INTRAMUSCULAR | Status: DC | PRN
  Administered 2015-10-17: 10 mg via INTRAVENOUS
  Filled 2015-10-16: qty 1

## 2015-10-16 NOTE — Progress Notes (Signed)
Subjective: She felt pretty good earlier, now nauseated after taking a bath.  Abdominal pain is better.  Objective: Vital signs in last 24 hours: Temp:  [98 F (36.7 C)-99 F (37.2 C)] 98.4 F (36.9 C) (02/22 0532) Pulse Rate:  [61-82] 82 (02/22 0532) Resp:  [11-22] 18 (02/22 0532) BP: (158-204)/(71-105) 184/97 mmHg (02/22 0532) SpO2:  [96 %-100 %] 97 % (02/22 0532) Weight:  [129.457 kg (285 lb 6.4 oz)] 129.457 kg (285 lb 6.4 oz) (02/21 1712) Last BM Date: 10/16/15 NPO 1 BM this AM 900 urine recorded yesterday   Afebrile, VSS  BP up some CMP OK, WBC is down to 13.4 K H/H is down  But stable.  Intake/Output from previous day: 02/21 0701 - 02/22 0700 In: 3878.8 [I.V.:3878.8] Out: 900 [Urine:900] Intake/Output this shift: Total I/O In: -  Out: 1 [Stool:1]  General appearance: alert, cooperative and no distress Resp: clear to auscultation bilaterally GI: soft, less tender this AM, some nausea now.  Lab Results:   Recent Labs  10/14/15 2104 10/16/15 0614  WBC 19.6* 13.4*  HGB 8.8* 8.4*  HCT 28.3* 26.4*  PLT 321 322    BMET  Recent Labs  10/14/15 2104 10/16/15 0614  NA 135 140  K 3.0* 3.5  CL 99* 106  CO2 22 22  GLUCOSE 130* 105*  BUN 8 5*  CREATININE 0.99 0.89  CALCIUM 9.3 8.7*   PT/INR  Recent Labs  10/15/15 1017  LABPROT 16.9*  INR 1.36     Recent Labs Lab 10/14/15 2104  AST 15  ALT 13*  ALKPHOS 88  BILITOT 0.5  PROT 7.5  ALBUMIN 3.1*     Lipase     Component Value Date/Time   LIPASE 38 10/14/2015 2104     Studies/Results: Ct Abdomen Pelvis W Contrast  10/15/2015  CLINICAL DATA:  Subacute onset of generalized abdominal pain and constipation. Lower back pain. Initial encounter. EXAM: CT ABDOMEN AND PELVIS WITH CONTRAST TECHNIQUE: Multidetector CT imaging of the abdomen and pelvis was performed using the standard protocol following bolus administration of intravenous contrast. CONTRAST:  OMNIPAQUE IOHEXOL 300 MG/ML  SOLN  COMPARISON:  None. FINDINGS: The visualized lung bases are clear.  A tiny hiatal hernia is noted. The liver and spleen are unremarkable in appearance. The gallbladder is within normal limits. The pancreas and adrenal glands are unremarkable. The kidneys are unremarkable in appearance. There is no evidence of hydronephrosis. No renal or ureteral stones are seen. No perinephric stranding is appreciated. The small bowel is unremarkable in appearance. The stomach is within normal limits. No acute vascular abnormalities are seen. There is a 7.6 x 3.2 x 5.6 cm collection of fluid and air noted superior to the mid sigmoid colon, at the lower abdomen. Mild associated peripheral enhancement is seen, compatible with evolving abscess. This appears to be contained within the mesentery. There is associated wall thickening along the mid sigmoid colon, with likely underlying diverticula. Findings are suspicious for contained perforated diverticulitis. Given the location of this collection, it would be difficult to drain percutaneously. Soft tissue inflammation is seen extending superiorly along the mesentery. The appendix is normal in caliber, without evidence of appendicitis. Minimal scattered diverticulosis is noted along the transverse and descending colon. A tiny umbilical hernia is noted, containing only fat. The bladder is mildly distended and grossly remarkable. The uterus is grossly unremarkable in appearance. The ovaries are relatively symmetric. No suspicious adnexal masses are seen. No inguinal lymphadenopathy is seen. No acute osseous abnormalities  are identified. IMPRESSION: 1. 7.6 x 3.2 x 5.6 cm collection of fluid and air superior to the mid sigmoid colon, at the lower abdomen. Mild associated peripheral enhancement, compatible with evolving mesenteric abscess. This appears to reflect contained perforated diverticulitis, given underlying colonic wall thickening and diverticula. Given the location of this collection,  it would be difficult to drain percutaneously. 2. Soft tissue inflammation seen extending superiorly along the mesentery. 3. Tiny umbilical hernia, containing only fat. 4. Minimal scattered diverticulosis along the transverse and descending colon. 5. Tiny hiatal hernia noted. These results were called by telephone at the time of interpretation on 10/15/2015 at 6:24 am to Dr. Dione Booze, who verbally acknowledged these results. Electronically Signed   By: Roanna Raider M.D.   On: 10/15/2015 06:30    Medications: . antiseptic oral rinse  7 mL Mouth Rinse q12n4p  . chlorhexidine  15 mL Mouth Rinse BID  . enoxaparin (LOVENOX) injection  40 mg Subcutaneous Q24H  . labetalol  200 mg Oral BID  . piperacillin-tazobactam (ZOSYN)  IV  3.375 g Intravenous Q8H   . sodium chloride 1,000 mL (10/16/15 0140)  . 0.9 % NaCl with KCl 40 mEq / L 100 mL/hr (10/15/15 2356)    Assessment/Plan Acute sigmoid diverticulitis with 7.6 x 3.2 x 5.6 cm abscess Hypertension  BMI 63.3 Hypokalemia Anemia  Antibiotics:   Day 2 Zosyn DVT: Lovenox/SCD   Plan:  I will giver her some sips later after nausea is better, continue antibiotics and bowel rest, hydration.  Leave some PRN hydralazine for BP.  If that stay up we may have to consider second agent.  LOS: 1 day    JENNINGS,WILLARD 10/16/2015 Less pain Add Phenergan for nausea Sips only Patient examined and I agree with the assessment and plan  Violeta Gelinas, MD, MPH, FACS Trauma: [phone] General Surgery: [phone]  10/16/2015 12:50 PM

## 2015-10-16 NOTE — Progress Notes (Signed)
Patient complains of chest pain at 1450 Bethany Houston notified. BP at 1350 was 192/98 Hydralazine 10 mg iv given Bp at 1450 178/84 O2 sat 99% RA

## 2015-10-16 NOTE — Consult Note (Signed)
[facility] Medical Consultation  Bethany Houston ZOX:[id number] DOB: November 01, 1970 DOA: 10/15/2015 PCP: No primary care provider on file.   Requesting physician: will jennings Date of consultation: 10/16/2015 Reason for consultation: uncontrolled BP and chest pain  Impression/Recommendations Principal Problem:   Chest pain Active Problems:   Diverticulitis of colon with perforation   Hypertension   Obesity (BMI 30-39.9)   Anemia   Hypokalemia   #1. Chest pain. Typical and atypical features. Heart score 3. Resolved at time of consult. No sob or hypoxia.  May be related to gerd vs uncontrolled BP. -monitor on tele -cycle troponin -serial EKG -better BP control -pain management -gi cocktail -echo for assessment of LV function -lipid panel -depending on results of above, may benefit OP stress test  #2. Uncontrolled BP. Reports usually controlled with home labetalol but also states PCP referred her to cardiologist due to uncontrolled BP.  - continue home labetolol -hydralazine prn with slightly lower parameters -improved pain control  3. Obesity. BMI 46.2. -nutritional consult  #4. Anemia. Reports hx same. No obvious s/sx bleeding -FOBT -anemia panel -monitor.   #5 hypokalemia. Likely related to decreased po intake in setting of diverticulitis. Potassium level 3.0 on admission. Trending up with supplement in IV.  -check magnesium level -continue IV supplement -monitor   [facility] will followup again tomorrow. Please contact  can be of assistance in the meanwhile. Thank you for this consultation.  Chief Complaint: chest pain   HPI: 45 yo hx htn, obesity admitted yesterday on general surgery service for abdominal pain. Evaluation revealed acute sigmoind diverticulitis with abscess. Not able to drain. IV antibiotics started. Complained nausea today inspite of zofran. Was given phenergan and shortly thereafter developed epigastric "ache". Denies radiation of pain, denies  diaphoresis, sob. Pain lasted less 5 minutes. Resolved at time of my exame   Review of Systems:  10 point review of systems complete all negative except as noted in HPI  Past Medical History  Diagnosis Date  . Hypertension   . Obesity (BMI 30-39.9)    History reviewed. No pertinent past surgical history. Social History:  reports that she has never smoked. She does not have any smokeless tobacco history on file. She reports that she does not drink alcohol. Her drug history is not on file.  Allergies  Allergen Reactions  . Penicillins Nausea And Vomiting   No family history on file.family medical hx positive for htn, diabetes  Prior to Admission medications   Medication Sig Start Date End Date Taking? Authorizing Provider  labetalol (NORMODYNE) 200 MG tablet Take 200 mg by mouth 2 (two) times daily.   Yes Historical Provider, MD   Physical Exam: Blood pressure 178/84, pulse 102, temperature 98.3 F (36.8 C), temperature source Oral, resp. rate 18, height  (1.676 m), weight 129.457 kg (285 lb 6.4 oz), SpO2 99 %. Filed Vitals:   10/16/15 1353 10/16/15 1449  BP: 192/98 178/84  Pulse: 68 102  Temp: 98.3 F (36.8 C)   Resp: 18      General:  Obese, appears comfortable  Eyes: PERRL EOMI no scleral icterus  ENT: ears clear nose without drainage oropharnyx without erythema or exudate  Neck: no JVD full rom  Cardiovascular: RRR no MGR trace LE edema  Respiratory: normal effort BS distant but clear no wheeze  Abdomen: obese soft +BS mild diffuse tenderness  Skin: warm and dry no lesions  Musculoskeletal: joints without swelling/erythems  Psychiatric: calm cooperative  Neurologic: speech clear facial symmetry  Labs on  Admission:  Basic Metabolic Panel:  Recent Labs Lab 10/14/15 2104 10/16/15 0614  NA 135 140  K 3.0* 3.5  CL 99* 106  CO2 22 22  GLUCOSE 130* 105*  BUN 8 5*  CREATININE 0.99 0.89  CALCIUM 9.3 8.7*   Liver Function Tests:  Recent  Labs Lab 10/14/15 2104  AST 15  ALT 13*  ALKPHOS 88  BILITOT 0.5  PROT 7.5  ALBUMIN 3.1*    Recent Labs Lab 10/14/15 2104  LIPASE 38   No results for input(s): AMMONIA in the last 168 hours. CBC:  Recent Labs Lab 10/14/15 2104 10/16/15 0614  WBC 19.6* 13.4*  HGB 8.8* 8.4*  HCT 28.3* 26.4*  MCV 64.0* 65.5*  PLT 321 322   Cardiac Enzymes: No results for input(s): CKTOTAL, CKMB, CKMBINDEX, TROPONINI in the last 168 hours. BNP: Invalid input(s): POCBNP CBG: No results for input(s): GLUCAP in the last 168 hours.  Radiological Exams on Admission: Ct Abdomen Pelvis W Contrast  10/15/2015  CLINICAL DATA:  Subacute onset of generalized abdominal pain and constipation. Lower back pain. Initial encounter. EXAM: CT ABDOMEN AND PELVIS WITH CONTRAST TECHNIQUE: Multidetector CT imaging of the abdomen and pelvis was performed using the standard protocol following bolus administration of intravenous contrast. CONTRAST:  OMNIPAQUE IOHEXOL 300 MG/ML  SOLN COMPARISON:  None. FINDINGS: The visualized lung bases are clear.  A tiny hiatal hernia is noted. The liver and spleen are unremarkable in appearance. The gallbladder is within normal limits. The pancreas and adrenal glands are unremarkable. The kidneys are unremarkable in appearance. There is no evidence of hydronephrosis. No renal or ureteral stones are seen. No perinephric stranding is appreciated. The small bowel is unremarkable in appearance. The stomach is within normal limits. No acute vascular abnormalities are seen. There is a 7.6 x 3.2 x 5.6 cm collection of fluid and air noted superior to the mid sigmoid colon, at the lower abdomen. Mild associated peripheral enhancement is seen, compatible with evolving abscess. This appears to be contained within the mesentery. There is associated wall thickening along the mid sigmoid colon, with likely underlying diverticula. Findings are suspicious for contained perforated diverticulitis.  Given the location of this collection, it would be difficult to drain percutaneously. Soft tissue inflammation is seen extending superiorly along the mesentery. The appendix is normal in caliber, without evidence of appendicitis. Minimal scattered diverticulosis is noted along the transverse and descending colon. A tiny umbilical hernia is noted, containing only fat. The bladder is mildly distended and grossly remarkable. The uterus is grossly unremarkable in appearance. The ovaries are relatively symmetric. No suspicious adnexal masses are seen. No inguinal lymphadenopathy is seen. No acute osseous abnormalities are identified. IMPRESSION: 1. 7.6 x 3.2 x 5.6 cm collection of fluid and air superior to the mid sigmoid colon, at the lower abdomen. Mild associated peripheral enhancement, compatible with evolving mesenteric abscess. This appears to reflect contained perforated diverticulitis, given underlying colonic wall thickening and diverticula. Given the location of this collection, it would be difficult to drain percutaneously. 2. Soft tissue inflammation seen extending superiorly along the mesentery. 3. Tiny umbilical hernia, containing only fat. 4. Minimal scattered diverticulosis along the transverse and descending colon. 5. Tiny hiatal hernia noted. These results were called by telephone at the time of interpretation on 10/15/2015 at 6:24 am to Dr. Dione Booze, who verbally acknowledged these results. Electronically Signed   By: Roanna Raider M.D.   On: 10/15/2015 06:30    EKG: Independently reviewed NSR  Time  spent: 55 minutes  [facility]   If 7PM-7AM, please contact night-coverage [email or web address] Password [facility] 10/16/2015, 4:03 PM

## 2015-10-16 NOTE — Progress Notes (Signed)
Called by nurse, she was having nausea unrelieved by Zofran and we ordered phenergan for her .  Then I was called with chest pain.  Pt notes it is mid upper sternum.  She doesn't think it is GERD symptoms.  She never had chest pain before.  Pain called 10/10, no SOB with it.  Pain is better now but still having some.   Exam: alert, does not appear in acute distress. Chest: clear Cardiac:  Tachycardic, no M or R. BP is better but that is with Hydralazine Imp:  1. New chest pain, rule out GI vs cardiac source.  2.  Poorly controlled hypertension  Plan:  Get EKG, ask Medicine to see and help chest pain and BP which has been poorly controlled here.  EKG, monitor, troponin's x 3, Mylanta.  Lytes were normal as was the CBC this AM.

## 2015-10-17 ENCOUNTER — Ambulatory Visit (HOSPITAL_COMMUNITY): Payer: Medicaid Other

## 2015-10-17 DIAGNOSIS — R079 Chest pain, unspecified: Secondary | ICD-10-CM

## 2015-10-17 DIAGNOSIS — D649 Anemia, unspecified: Secondary | ICD-10-CM

## 2015-10-17 DIAGNOSIS — I10 Essential (primary) hypertension: Secondary | ICD-10-CM

## 2015-10-17 LAB — BASIC METABOLIC PANEL
Anion gap: 9 (ref 5–15)
BUN: <5 mg/dL — ABNORMAL LOW (ref 6–20)
CHLORIDE: 108 mmol/L (ref 101–111)
CO2: 22 mmol/L (ref 22–32)
Calcium: 8.9 mg/dL (ref 8.9–10.3)
Creatinine, Ser: 0.94 mg/dL (ref 0.44–1.00)
GFR calc non Af Amer: >60 mL/min (ref >60)
Glucose, Bld: 100 mg/dL — ABNORMAL HIGH (ref 65–99)
POTASSIUM: 4 mmol/L (ref 3.5–5.1)
SODIUM: 139 mmol/L (ref 135–145)

## 2015-10-17 LAB — CBC
HEMATOCRIT: 27.7 % — AB (ref 36.0–46.0)
Hemoglobin: 8.5 g/dL — ABNORMAL LOW (ref 12.0–15.0)
MCH: 19.9 pg — ABNORMAL LOW (ref 26.0–34.0)
MCHC: 30.7 g/dL (ref 30.0–36.0)
MCV: 64.9 fL — AB (ref 78.0–100.0)
Platelets: 349 10*3/uL (ref 150–400)
RBC: 4.27 MIL/uL (ref 3.87–5.11)
RDW: 21.6 % — ABNORMAL HIGH (ref 11.5–15.5)
WBC: 9.6 10*3/uL (ref 4.0–10.5)

## 2015-10-17 LAB — TROPONIN I: Troponin I: <0.03 ng/mL (ref <0.031)

## 2015-10-17 MED ORDER — PANTOPRAZOLE SODIUM 40 MG IV SOLR
40.0000 mg | INTRAVENOUS | Status: DC
  Administered 2015-10-17 – 2015-10-20 (×4): 40 mg via INTRAVENOUS
  Filled 2015-10-17 (×5): qty 40

## 2015-10-17 MED ORDER — LISINOPRIL 10 MG PO TABS
10.0000 mg | ORAL_TABLET | Freq: Every day | ORAL | Status: DC
  Administered 2015-10-17 – 2015-10-18 (×2): 10 mg via ORAL
  Filled 2015-10-17 (×2): qty 1

## 2015-10-17 MED ORDER — ACETAMINOPHEN 10 MG/ML IV SOLN
1000.0000 mg | Freq: Once | INTRAVENOUS | Status: AC
  Administered 2015-10-17: 1000 mg via INTRAVENOUS
  Filled 2015-10-17: qty 100

## 2015-10-17 MED ORDER — METOPROLOL TARTRATE 1 MG/ML IV SOLN
2.5000 mg | INTRAVENOUS | Status: DC | PRN
  Administered 2015-10-17 – 2015-10-20 (×5): 2.5 mg via INTRAVENOUS
  Filled 2015-10-17 (×4): qty 5

## 2015-10-17 MED ORDER — LABETALOL HCL 100 MG PO TABS
300.0000 mg | ORAL_TABLET | Freq: Two times a day (BID) | ORAL | Status: DC
  Administered 2015-10-17 – 2015-10-20 (×7): 300 mg via ORAL
  Filled 2015-10-17 (×7): qty 3

## 2015-10-17 MED ORDER — PROCHLORPERAZINE EDISYLATE 5 MG/ML IJ SOLN
10.0000 mg | Freq: Four times a day (QID) | INTRAMUSCULAR | Status: DC | PRN
  Administered 2015-10-17: 10 mg via INTRAVENOUS
  Filled 2015-10-17 (×2): qty 2

## 2015-10-17 MED ORDER — SODIUM CHLORIDE 0.9 % IV SOLN
1.0000 g | INTRAVENOUS | Status: DC
  Administered 2015-10-17 – 2015-10-19 (×3): 1 g via INTRAVENOUS
  Filled 2015-10-17 (×4): qty 1

## 2015-10-17 MED ORDER — METOPROLOL TARTRATE 1 MG/ML IV SOLN: 5.0000 mg | INTRAVENOUS | Status: DC | PRN

## 2015-10-17 NOTE — Progress Notes (Signed)
PATIENT DETAILS Name: Bethany Houston Age: 45 y.o. Sex: female Date of Birth: [birth year] Admit Date: 10/15/2015 Admitting Physician Ozella Rocks, MD PCP:No primary care provider on file.  Subjective: No further chest pain. Very minimal left lower quadrant pain.  Assessment/Plan: Chest pain: Resolved Likely atypical. EKG/troponins negative. Check echo-but doubt if any inpatient workup is needed.  Active Problems: Hypertension: suspect worsened by left lower quadrant pain/IV fluids. Increase labetalol to 300 mg twice a day. Follow, if needed we can add a second agent over the next few days.  Microcytic anemia: Iron panel surprisingly shows a normal ferritin level. Hemoglobin is stable, suspect this is stable for outpatient monitoring.   Diverticular abscess: Per primary team  Disposition: Per primary team  Antimicrobial agents  See below  Anti-infectives    Start     Dose/Rate Route Frequency Ordered Stop   10/15/15 1000  piperacillin-tazobactam (ZOSYN) IVPB 3.375 g     3.375 g 12.5 mL/hr over 240 Minutes Intravenous Every 8 hours 10/15/15 0846     10/15/15 0845  ciprofloxacin (CIPRO) IVPB 400 mg  Status:  Discontinued     400 mg 200 mL/hr over 60 Minutes Intravenous Every 12 hours 10/15/15 0831 10/15/15 0845   10/15/15 0845  metroNIDAZOLE (FLAGYL) IVPB 500 mg  Status:  Discontinued     500 mg 100 mL/hr over 60 Minutes Intravenous Every 8 hours 10/15/15 0831 10/15/15 0845   10/15/15 0630  ciprofloxacin (CIPRO) IVPB 400 mg     400 mg 200 mL/hr over 60 Minutes Intravenous  Once 10/15/15 0625 10/15/15 0916   10/15/15 0630  metroNIDAZOLE (FLAGYL) IVPB 500 mg     500 mg 100 mL/hr over 60 Minutes Intravenous  Once 10/15/15 4098 10/15/15 0916     Time spent 20 minutes-Greater than 50% of this time was spent in counseling, explanation of diagnosis, planning of further management, and coordination of care.  MEDICATIONS: Scheduled Meds: . alum & mag  hydroxide-simeth  30 mL Oral Once  . antiseptic oral rinse  7 mL Mouth Rinse q12n4p  . chlorhexidine  15 mL Mouth Rinse BID  . enoxaparin (LOVENOX) injection  40 mg Subcutaneous Q24H  . labetalol  300 mg Oral BID  . pantoprazole (PROTONIX) IV  40 mg Intravenous Q24H  . piperacillin-tazobactam (ZOSYN)  IV  3.375 g Intravenous Q8H   Continuous Infusions: . 0.9 % NaCl with KCl 40 mEq / L 100 mL/hr (10/17/15 0626)   PRN Meds:.acetaminophen **OR** acetaminophen, diphenhydrAMINE **OR** diphenhydrAMINE, gi cocktail, hydrALAZINE, ondansetron **OR** ondansetron (ZOFRAN) IV, oxyCODONE-acetaminophen, promethazine    PHYSICAL EXAM: Vital signs in last 24 hours: Filed Vitals:   10/17/15 0600 10/17/15 0920 10/17/15 0941 10/17/15 1000  BP: 173/89 154/81  155/80  Pulse:   64 68  Temp:    98.4 F (36.9 C)  TempSrc:    Oral  Resp:    18  Height:      Weight:      SpO2:    100%    Weight change:  Filed Weights   10/15/15 1712  Weight: 129.457 kg (285 lb 6.4 oz)   Body mass index is 46.09 kg/(m^2).   Gen Exam: Awake and alert with clear speech.   Neck: Supple, No JVD.   Chest: B/L Clear.   CVS: S1 S2 Regular, no murmurs.  Abdomen: soft, BS +, mildly tender in LLQ, non distended.  Extremities: no edema, lower extremities  warm to touch. Neurologic: Non Focal.   Skin: No Rash.   Wounds: N/A.    Intake/Output from previous day:  Intake/Output Summary (Last 24 hours) at 10/17/15 1128 Last data filed at 10/17/15 0900  Gross per 24 hour  Intake 3053.33 ml  Output   1300 ml  Net 1753.33 ml     LAB RESULTS: CBC  Recent Labs Lab 10/14/15 2104 10/16/15 0614 10/17/15 0346  WBC 19.6* 13.4* 9.6  HGB 8.8* 8.4* 8.5*  HCT 28.3* 26.4* 27.7*  PLT 321 322 349  MCV 64.0* 65.5* 64.9*  MCH 19.9* 20.8* 19.9*  MCHC 31.1 31.8 30.7  RDW 21.1* 21.6* 21.6*    Chemistries   Recent Labs Lab 10/14/15 2104 10/16/15 0614 10/16/15 1616 10/17/15 0346  NA 135 140  --  139  K 3.0* 3.5  --   4.0  CL 99* 106  --  108  CO2 22 22  --  22  GLUCOSE 130* 105*  --  100*  BUN 8 5*  --  <5*  CREATININE 0.99 0.89  --  0.94  CALCIUM 9.3 8.7*  --  8.9  MG  --   --  2.0  --     CBG: No results for input(s): GLUCAP in the last 168 hours.  GFR Estimated Creatinine Clearance: 104.3 mL/min (by C-G formula based on Cr of 0.94).  Coagulation profile  Recent Labs Lab 10/15/15 1017  INR 1.36    Cardiac Enzymes  Recent Labs Lab 10/16/15 1515 10/16/15 2120 10/17/15 0346  TROPONINI <0.03 <0.03 <0.03    Invalid input(s): POCBNP No results for input(s): DDIMER in the last 72 hours. No results for input(s): HGBA1C in the last 72 hours.  Recent Labs  10/16/15 1515  CHOL 159  HDL 35*  LDLCALC 106*  TRIG 88  CHOLHDL 4.5   No results for input(s): TSH, T4TOTAL, T3FREE, THYROIDAB in the last 72 hours.  Invalid input(s): FREET3  Recent Labs  10/16/15 1616  VITAMINB12 584  FOLATE 22.1  FERRITIN 56  TIBC 302  IRON 18*  RETICCTPCT 0.6    Recent Labs  10/14/15 2104  LIPASE 38    Urine Studies No results for input(s): UHGB, CRYS in the last 72 hours.  Invalid input(s): UACOL, UAPR, USPG, UPH, UTP, UGL, UKET, UBIL, UNIT, UROB, ULEU, UEPI, UWBC, URBC, UBAC, CAST, UCOM, BILUA  MICROBIOLOGY: No results found for this or any previous visit (from the past 240 hour(s)).  RADIOLOGY STUDIES/RESULTS: Ct Abdomen Pelvis W Contrast  10/15/2015  CLINICAL DATA:  Subacute onset of generalized abdominal pain and constipation. Lower back pain. Initial encounter. EXAM: CT ABDOMEN AND PELVIS WITH CONTRAST TECHNIQUE: Multidetector CT imaging of the abdomen and pelvis was performed using the standard protocol following bolus administration of intravenous contrast. CONTRAST:  OMNIPAQUE IOHEXOL 300 MG/ML  SOLN COMPARISON:  None. FINDINGS: The visualized lung bases are clear.  A tiny hiatal hernia is noted. The liver and spleen are unremarkable in appearance. The gallbladder is  within normal limits. The pancreas and adrenal glands are unremarkable. The kidneys are unremarkable in appearance. There is no evidence of hydronephrosis. No renal or ureteral stones are seen. No perinephric stranding is appreciated. The small bowel is unremarkable in appearance. The stomach is within normal limits. No acute vascular abnormalities are seen. There is a 7.6 x 3.2 x 5.6 cm collection of fluid and air noted superior to the mid sigmoid colon, at the lower abdomen. Mild associated peripheral enhancement is seen, compatible  with evolving abscess. This appears to be contained within the mesentery. There is associated wall thickening along the mid sigmoid colon, with likely underlying diverticula. Findings are suspicious for contained perforated diverticulitis. Given the location of this collection, it would be difficult to drain percutaneously. Soft tissue inflammation is seen extending superiorly along the mesentery. The appendix is normal in caliber, without evidence of appendicitis. Minimal scattered diverticulosis is noted along the transverse and descending colon. A tiny umbilical hernia is noted, containing only fat. The bladder is mildly distended and grossly remarkable. The uterus is grossly unremarkable in appearance. The ovaries are relatively symmetric. No suspicious adnexal masses are seen. No inguinal lymphadenopathy is seen. No acute osseous abnormalities are identified. IMPRESSION: 1. 7.6 x 3.2 x 5.6 cm collection of fluid and air superior to the mid sigmoid colon, at the lower abdomen. Mild associated peripheral enhancement, compatible with evolving mesenteric abscess. This appears to reflect contained perforated diverticulitis, given underlying colonic wall thickening and diverticula. Given the location of this collection, it would be difficult to drain percutaneously. 2. Soft tissue inflammation seen extending superiorly along the mesentery. 3. Tiny umbilical hernia, containing only fat.  4. Minimal scattered diverticulosis along the transverse and descending colon. 5. Tiny hiatal hernia noted. These results were called by telephone at the time of interpretation on 10/15/2015 at 6:24 am to Dr. Dione Booze, who verbally acknowledged these results. Electronically Signed   By: Roanna Raider M.D.   On: 10/15/2015 06:30    Jeoffrey Massed, MD  [facility] Pager:[phone]  If 7PM-7AM, please contact night-coverage [email or web address] Password [email or web address] 10/17/2015, 11:28 AM   LOS: 2 days

## 2015-10-17 NOTE — Progress Notes (Signed)
  Subjective: Sleeping, minimal pain mostly LLQ she says it's a 2.  No further chest pain.  Objective: Vital signs in last 24 hours: Temp:  [98 F (36.7 C)-98.8 F (37.1 C)] 98 F (36.7 C) (02/23 0458) Pulse Rate:  [68-102] 71 (02/23 0458) Resp:  [17-18] 18 (02/23 0458) BP: (173-192)/(84-98) 173/89 mmHg (02/23 0600) SpO2:  [99 %-100 %] 100 % (02/23 0458) Last BM Date: 10/17/15 240 PO 1500 urine BM x 1 Afebrile, VSS  BP still up WBC down to normal Troponin negative x 3 Intake/Output from previous day: 02/22 0701 - 02/23 0700 In: 2755 [P.O.:240; I.V.:2415; IV Piggyback:100] Out: 1501 [Urine:1500; Stool:1] Intake/Output this shift: Total I/O In: 178.3 [I.V.:178.3] Out: -   General appearance: cooperative and no distress GI: soft, minimally tenderness LLQ, + BS.  Lab Results:   Recent Labs  10/16/15 0614 10/17/15 0346  WBC 13.4* 9.6  HGB 8.4* 8.5*  HCT 26.4* 27.7*  PLT 322 349    BMET  Recent Labs  10/16/15 0614 10/17/15 0346  NA 140 139  K 3.5 4.0  CL 106 108  CO2 22 22  GLUCOSE 105* 100*  BUN 5* <5*  CREATININE 0.89 0.94  CALCIUM 8.7* 8.9   PT/INR  Recent Labs  10/15/15 1017  LABPROT 16.9*  INR 1.36     Recent Labs Lab 10/14/15 2104  AST 15  ALT 13*  ALKPHOS 88  BILITOT 0.5  PROT 7.5  ALBUMIN 3.1*     Lipase     Component Value Date/Time   LIPASE 38 10/14/2015 2104     Studies/Results: No results found.  Medications: . alum & mag hydroxide-simeth  30 mL Oral Once  . antiseptic oral rinse  7 mL Mouth Rinse q12n4p  . chlorhexidine  15 mL Mouth Rinse BID  . enoxaparin (LOVENOX) injection  40 mg Subcutaneous Q24H  . labetalol  300 mg Oral BID  . pantoprazole (PROTONIX) IV  40 mg Intravenous Q24H  . piperacillin-tazobactam (ZOSYN)  IV  3.375 g Intravenous Q8H    Assessment/Plan Acute sigmoid diverticulitis with 7.6 x 3.2 x 5.6 cm abscess Hypertension  BMI 63.3 Hypokalemia Anemia  Chest pain  -  Troponin's are  negative, no changes on EKG Antibiotics:  Day 3 Zosyn DVT:  Lovenox/SCD   Plan:  Clears, mobilize and see how she does.  I would add a second agent to her BP meds, but will defer to Medicine.    LOS: 2 days    Basha Krygier 10/17/2015

## 2015-10-17 NOTE — Progress Notes (Signed)
  Echocardiogram 2D Echocardiogram has been performed.  Cathie Beams 10/17/2015, 4:24 PM

## 2015-10-17 NOTE — Progress Notes (Signed)
Brief Nutrition Note  RD consulted for diet education for obesity.   Staff reports pt has had a lot of nausea throughout shift. She is currently NPO.   RD will follow-up with education once pt is closer to discharge.   Dasiah Hooley A. Mayford Knife, RD, LDN, CDE Pager: [phone] After hours Pager: [phone]

## 2015-10-18 MED ORDER — SACCHAROMYCES BOULARDII 250 MG PO CAPS
250.0000 mg | ORAL_CAPSULE | Freq: Two times a day (BID) | ORAL | Status: DC
  Administered 2015-10-18 – 2015-10-20 (×5): 250 mg via ORAL
  Filled 2015-10-18 (×5): qty 1

## 2015-10-18 MED ORDER — LISINOPRIL 20 MG PO TABS: 20.0000 mg | ORAL_TABLET | Freq: Every day | ORAL | Status: DC

## 2015-10-18 NOTE — Progress Notes (Signed)
  Subjective: Nausea is better, pain is better, she says she is taking in more Po than recorded.  Some blood in her stool yesterday.    Objective: Vital signs in last 24 hours: Temp:  [98 F (36.7 C)-98.6 F (37 C)] 98.1 F (36.7 C) (02/24 0544) Pulse Rate:  [64-76] 67 (02/24 0700) Resp:  [18-20] 20 (02/24 0544) BP: (136-197)/(72-95) 169/78 mmHg (02/24 0700) SpO2:  [97 %-100 %] 99 % (02/24 0544) Last BM Date: 10/17/15 300 PO 3350 urine Afebrile, BP is trending down with increased Labetalol, and she is starting lisinopril also No labs this AM Intake/Output from previous day: 02/23 0701 - 02/24 0700 In: 2678.3 [P.O.:300; I.V.:2378.3] Out: 3350 [Urine:3350] Intake/Output this shift: Total I/O In: -  Out: 400 [Urine:400]  General appearance: alert, cooperative and no distress Resp: clear to auscultation bilaterally GI: soft, pain is rated only at a 1 today.  No pain on palpation.    Lab Results:   Recent Labs  10/16/15 0614 10/17/15 0346  WBC 13.4* 9.6  HGB 8.4* 8.5*  HCT 26.4* 27.7*  PLT 322 349    BMET  Recent Labs  10/16/15 0614 10/17/15 0346  NA 140 139  K 3.5 4.0  CL 106 108  CO2 22 22  GLUCOSE 105* 100*  BUN 5* <5*  CREATININE 0.89 0.94  CALCIUM 8.7* 8.9   PT/INR  Recent Labs  10/15/15 1017  LABPROT 16.9*  INR 1.36     Recent Labs Lab 10/14/15 2104  AST 15  ALT 13*  ALKPHOS 88  BILITOT 0.5  PROT 7.5  ALBUMIN 3.1*     Lipase     Component Value Date/Time   LIPASE 38 10/14/2015 2104     Studies/Results: No results found.  Medications: . alum & mag hydroxide-simeth  30 mL Oral Once  . antiseptic oral rinse  7 mL Mouth Rinse q12n4p  . chlorhexidine  15 mL Mouth Rinse BID  . enoxaparin (LOVENOX) injection  40 mg Subcutaneous Q24H  . ertapenem  1 g Intravenous Q24H  . labetalol  300 mg Oral BID  . lisinopril  10 mg Oral Daily  . pantoprazole (PROTONIX) IV  40 mg Intravenous Q24H    Assessment/Plan Acute sigmoid  diverticulitis with 7.6 x 3.2 x 5.6 cm abscess Hypertension  BMI 63.3 Hypokalemia Anemia  Chest pain - Troponin's are negative, no changes on EKG Antibiotics: Day 3 Zosyn changed to Invanz 10/17/15  Day 2 Invanz DVT: Lovenox/SCD   Plan:  Full liquids today, appreciate Medicines help with her blood pressure.  Echo done yesterday with normal EF, but grade II diastolic dysfunction. LVH,  Trivial pericardial effusion.  Nausea is better off Zosyn, will need to discuss need to repeat CT and conversion to oral antibiotics.  Start her on probiotic today with full liquids.  I will also decrease her IV fluids to 50 ml/hr.    LOS: 3 days    Shylynn Bruning 10/18/2015

## 2015-10-18 NOTE — Progress Notes (Signed)
PATIENT DETAILS Name: Bethany Houston Age: 45 y.o. Sex: female Date of Birth: Oct 28, 1970 Admit Date: 10/15/2015 Admitting Physician Ozella Rocks, MD PCP:No primary care provider on file.  Subjective: No further chest pain. Very minimal left lower quadrant pain.  Assessment/Plan: Chest pain: Resolved Likely atypical-suspect GI etiology. EKG/troponins negative. Echo shows preserved ejection fraction without any wall motion abnormality- doubt if any inpatient workup is needed. Recommend to continue PPI.  Active Problems: Hypertension: suspect worsened by left lower quadrant pain/IV fluids. Continue labetalol to 300 mg twice a day, increase lisinopril to 20 mg. Follow  Microcytic anemia: Iron panel surprisingly shows a normal ferritin level. Hemoglobin is stable, suspect this is stable for outpatient monitoring.   Diverticular abscess: Per primary team  Disposition: Per primary team  Antimicrobial agents  See below  Anti-infectives    Start     Dose/Rate Route Frequency Ordered Stop   10/17/15 1415  ertapenem (INVANZ) 1 g in sodium chloride 0.9 % 50 mL IVPB     1 g 100 mL/hr over 30 Minutes Intravenous Every 24 hours 10/17/15 1328     10/15/15 1000  piperacillin-tazobactam (ZOSYN) IVPB 3.375 g  Status:  Discontinued     3.375 g 12.5 mL/hr over 240 Minutes Intravenous Every 8 hours 10/15/15 0846 10/17/15 1328   10/15/15 0845  ciprofloxacin (CIPRO) IVPB 400 mg  Status:  Discontinued     400 mg 200 mL/hr over 60 Minutes Intravenous Every 12 hours 10/15/15 0831 10/15/15 0845   10/15/15 0845  metroNIDAZOLE (FLAGYL) IVPB 500 mg  Status:  Discontinued     500 mg 100 mL/hr over 60 Minutes Intravenous Every 8 hours 10/15/15 0831 10/15/15 0845   10/15/15 0630  ciprofloxacin (CIPRO) IVPB 400 mg     400 mg 200 mL/hr over 60 Minutes Intravenous  Once 10/15/15 0625 10/15/15 0916   10/15/15 0630  metroNIDAZOLE (FLAGYL) IVPB 500 mg     500 mg 100 mL/hr over 60 Minutes  Intravenous  Once 10/15/15 1610 10/15/15 0916     Time spent 20 minutes-Greater than 50% of this time was spent in counseling, explanation of diagnosis, planning of further management, and coordination of care.  MEDICATIONS: Scheduled Meds: . alum & mag hydroxide-simeth  30 mL Oral Once  . enoxaparin (LOVENOX) injection  40 mg Subcutaneous Q24H  . ertapenem  1 g Intravenous Q24H  . labetalol  300 mg Oral BID  . [START ON 10/19/2015] lisinopril  20 mg Oral Daily  . pantoprazole (PROTONIX) IV  40 mg Intravenous Q24H  . saccharomyces boulardii  250 mg Oral BID   Continuous Infusions: . 0.9 % NaCl with KCl 40 mEq / L 50 mL/hr (10/18/15 0942)   PRN Meds:.acetaminophen **OR** acetaminophen, diphenhydrAMINE **OR** diphenhydrAMINE, gi cocktail, metoprolol, ondansetron **OR** ondansetron (ZOFRAN) IV, oxyCODONE-acetaminophen, prochlorperazine, promethazine    PHYSICAL EXAM: Vital signs in last 24 hours: Filed Vitals:   10/17/15 2128 10/18/15 0112 10/18/15 0544 10/18/15 0700  BP: 179/94 136/72 187/95 169/78  Pulse: 66 75 69 67  Temp: 98.3 F (36.8 C) 98 F (36.7 C) 98.1 F (36.7 C)   TempSrc: Oral Oral Oral   Resp: Height:      Weight:      SpO2: 99% 97% 99%     Weight change:  Filed Weights   10/15/15 1712  Weight: 129.457 kg (285 lb 6.4 oz)   Body mass  index is 46.09 kg/(m^2).   Gen Exam: Awake and alert with clear speech.   Neck: Supple, No JVD.   Chest: B/L Clear.   CVS: S1 S2 Regular, no murmurs.  Abdomen: soft, BS +, mildly tender in LLQ, non distended.  Extremities: no edema, lower extremities warm to touch. Neurologic: Non Focal.   Skin: No Rash.   Wounds: N/A.    Intake/Output from previous day:  Intake/Output Summary (Last 24 hours) at 10/18/15 1356 Last data filed at 10/18/15 1115  Gross per 24 hour  Intake   2440 ml  Output   3400 ml  Net   -960 ml     LAB RESULTS: CBC  Recent Labs Lab 10/14/15 2104 10/16/15 0614 10/17/15 0346    WBC 19.6* 13.4* 9.6  HGB 8.8* 8.4* 8.5*  HCT 28.3* 26.4* 27.7*  PLT 321 322 349  MCV 64.0* 65.5* 64.9*  MCH 19.9* 20.8* 19.9*  MCHC 31.1 31.8 30.7  RDW 21.1* 21.6* 21.6*    Chemistries   Recent Labs Lab 10/14/15 2104 10/16/15 0614 10/16/15 1616 10/17/15 0346  NA 135 140  --  139  K 3.0* 3.5  --  4.0  CL 99* 106  --  108  CO2 22 22  --  22  GLUCOSE 130* 105*  --  100*  BUN 8 5*  --  <5*  CREATININE 0.99 0.89  --  0.94  CALCIUM 9.3 8.7*  --  8.9  MG  --   --  2.0  --     CBG: No results for input(s): GLUCAP in the last 168 hours.  GFR Estimated Creatinine Clearance: 104.3 mL/min (by C-G formula based on Cr of 0.94).  Coagulation profile  Recent Labs Lab 10/15/15 1017  INR 1.36    Cardiac Enzymes  Recent Labs Lab 10/16/15 1515 10/16/15 2120 10/17/15 0346  TROPONINI <0.03 <0.03 <0.03    Invalid input(s): POCBNP No results for input(s): DDIMER in the last 72 hours. No results for input(s): HGBA1C in the last 72 hours.  Recent Labs  10/16/15 1515  CHOL 159  HDL 35*  LDLCALC 106*  TRIG 88  CHOLHDL 4.5   No results for input(s): TSH, T4TOTAL, T3FREE, THYROIDAB in the last 72 hours.  Invalid input(s): FREET3  Recent Labs  10/16/15 1616  VITAMINB12 584  FOLATE 22.1  FERRITIN 56  TIBC 302  IRON 18*  RETICCTPCT 0.6   No results for input(s): LIPASE, AMYLASE in the last 72 hours.  Urine Studies No results for input(s): UHGB, CRYS in the last 72 hours.  Invalid input(s): UACOL, UAPR, USPG, UPH, UTP, UGL, UKET, UBIL, UNIT, UROB, ULEU, UEPI, UWBC, URBC, UBAC, CAST, UCOM, BILUA  MICROBIOLOGY: No results found for this or any previous visit (from the past 240 hour(s)).  RADIOLOGY STUDIES/RESULTS: Ct Abdomen Pelvis W Contrast  10/15/2015  CLINICAL DATA:  Subacute onset of generalized abdominal pain and constipation. Lower back pain. Initial encounter. EXAM: CT ABDOMEN AND PELVIS WITH CONTRAST TECHNIQUE: Multidetector CT imaging of the abdomen  and pelvis was performed using the standard protocol following bolus administration of intravenous contrast. CONTRAST:  OMNIPAQUE IOHEXOL 300 MG/ML  SOLN COMPARISON:  None. FINDINGS: The visualized lung bases are clear.  A tiny hiatal hernia is noted. The liver and spleen are unremarkable in appearance. The gallbladder is within normal limits. The pancreas and adrenal glands are unremarkable. The kidneys are unremarkable in appearance. There is no evidence of hydronephrosis. No renal or ureteral stones are seen. No  perinephric stranding is appreciated. The small bowel is unremarkable in appearance. The stomach is within normal limits. No acute vascular abnormalities are seen. There is a 7.6 x 3.2 x 5.6 cm collection of fluid and air noted superior to the mid sigmoid colon, at the lower abdomen. Mild associated peripheral enhancement is seen, compatible with evolving abscess. This appears to be contained within the mesentery. There is associated wall thickening along the mid sigmoid colon, with likely underlying diverticula. Findings are suspicious for contained perforated diverticulitis. Given the location of this collection, it would be difficult to drain percutaneously. Soft tissue inflammation is seen extending superiorly along the mesentery. The appendix is normal in caliber, without evidence of appendicitis. Minimal scattered diverticulosis is noted along the transverse and descending colon. A tiny umbilical hernia is noted, containing only fat. The bladder is mildly distended and grossly remarkable. The uterus is grossly unremarkable in appearance. The ovaries are relatively symmetric. No suspicious adnexal masses are seen. No inguinal lymphadenopathy is seen. No acute osseous abnormalities are identified. IMPRESSION: 1. 7.6 x 3.2 x 5.6 cm collection of fluid and air superior to the mid sigmoid colon, at the lower abdomen. Mild associated peripheral enhancement, compatible with evolving mesenteric  abscess. This appears to reflect contained perforated diverticulitis, given underlying colonic wall thickening and diverticula. Given the location of this collection, it would be difficult to drain percutaneously. 2. Soft tissue inflammation seen extending superiorly along the mesentery. 3. Tiny umbilical hernia, containing only fat. 4. Minimal scattered diverticulosis along the transverse and descending colon. 5. Tiny hiatal hernia noted. These results were called by telephone at the time of interpretation on 10/15/2015 at 6:24 am to Dr. Dione Booze, who verbally acknowledged these results. Electronically Signed   By: Roanna Raider M.D.   On: 10/15/2015 06:30    Jeoffrey Massed, MD  [facility] Pager:[phone]  If 7PM-7AM, please contact night-coverage [email or web address] Password [email or web address] 10/18/2015, 1:56 PM   LOS: 3 days

## 2015-10-18 NOTE — Care Management Note (Signed)
Case Management Note  Patient Details  Name: Bethany Houston MRN: [id number] Date of Birth: Aug 08, 1971  Subjective/Objective:                    Action/Plan:  UR updated  Expected Discharge Date:                  Expected Discharge Plan:  Home/Self Care  In-House Referral:     Discharge planning Services     Post Acute Care Choice:    Choice offered to:     DME Arranged:    DME Agency:     HH Arranged:    HH Agency:     Status of Service:  In process, will continue to follow  Medicare Important Message Given:    Date Medicare IM Given:    Medicare IM give by:    Date Additional Medicare IM Given:    Additional Medicare Important Message give by:     If discussed at Long Length of Stay Meetings, dates discussed:    Additional Comments:  Kingsley Plan, RN 10/18/2015, 10:29 AM

## 2015-10-19 MED ORDER — LISINOPRIL 40 MG PO TABS
40.0000 mg | ORAL_TABLET | Freq: Every day | ORAL | Status: DC
  Administered 2015-10-19 – 2015-10-20 (×2): 40 mg via ORAL
  Filled 2015-10-19 (×2): qty 1

## 2015-10-19 NOTE — Progress Notes (Addendum)
  Subjective: Feels great, having bowel function, tol clears no pain  Objective: Vital signs in last 24 hours: Temp:  [98.2 F (36.8 C)-98.8 F (37.1 C)] 98.2 F (36.8 C) (02/25 0509) Pulse Rate:  [67-75] 74 (02/25 0509) Resp:  [20] 20 (02/25 0509) BP: (158-183)/(70-84) 162/70 mmHg (02/25 0635) SpO2:  [95 %-99 %] 99 % (02/25 0509) Last BM Date: 10/17/15  Intake/Output from previous day: 02/24 0701 - 02/25 0700 In: 1918.3 [P.O.:760; I.V.:1158.3] Out: 3000 [Urine:3000] Intake/Output this shift:    GI: soft nt  Lab Results:   Recent Labs  10/17/15 0346  WBC 9.6  HGB 8.5*  HCT 27.7*  PLT 349   BMET  Recent Labs  10/17/15 0346  NA 139  K 4.0  CL 108  CO2 22  GLUCOSE 100*  BUN <5*  CREATININE 0.94  CALCIUM 8.9   PT/INR No results for input(s): LABPROT, INR in the last 72 hours. ABG No results for input(s): PHART, HCO3 in the last 72 hours.  Invalid input(s): PCO2, PO2  Studies/Results: No results found.  Anti-infectives: Anti-infectives    Start     Dose/Rate Route Frequency Ordered Stop   10/17/15 1415  ertapenem (INVANZ) 1 g in sodium chloride 0.9 % 50 mL IVPB     1 g 100 mL/hr over 30 Minutes Intravenous Every 24 hours 10/17/15 1328     10/15/15 1000  piperacillin-tazobactam (ZOSYN) IVPB 3.375 g  Status:  Discontinued     3.375 g 12.5 mL/hr over 240 Minutes Intravenous Every 8 hours 10/15/15 0846 10/17/15 1328   10/15/15 0845  ciprofloxacin (CIPRO) IVPB 400 mg  Status:  Discontinued     400 mg 200 mL/hr over 60 Minutes Intravenous Every 12 hours 10/15/15 0831 10/15/15 0845   10/15/15 0845  metroNIDAZOLE (FLAGYL) IVPB 500 mg  Status:  Discontinued     500 mg 100 mL/hr over 60 Minutes Intravenous Every 8 hours 10/15/15 0831 10/15/15 0845   10/15/15 0630  ciprofloxacin (CIPRO) IVPB 400 mg     400 mg 200 mL/hr over 60 Minutes Intravenous  Once 10/15/15 0625 10/15/15 0916   10/15/15 0630  metroNIDAZOLE (FLAGYL) IVPB 500 mg     500 mg 100 mL/hr  over 60 Minutes Intravenous  Once 10/15/15 4098 10/15/15 1191      Assessment/Plan: Diverticular abscess  Continue abx, can advance diet Likely reimage before dc at some point or as outpatient with follow up D 3 invanx On antihypertensives with medicine following lovenox scds   [facility] 10/19/2015

## 2015-10-19 NOTE — Progress Notes (Signed)
PATIENT DETAILS Name: Bethany Houston Age: 45 y.o. Sex: female Date of Birth: [birth year] Admit Date: 10/15/2015 Admitting Physician Ozella Rocks, MD PCP:No primary care provider on file.  Subjective: No further chest pain orl left lower quadrant pain.  Assessment/Plan: Chest pain: Resolved Likely atypical-suspect GI etiology. EKG/troponins negative. Echo shows preserved ejection fraction without any wall motion abnormality- doubt if any inpatient workup is needed. Recommend to continue PPI.  Active Problems: Hypertension: suspect worsened by left lower quadrant pain/IV fluids. Continue labetalol to 300 mg twice a day, increase lisinopril to 40 mg. Follow  Microcytic anemia: Iron panel surprisingly shows a normal ferritin level. Hemoglobin is stable, suspect this is stable for outpatient monitoring.   Diverticular abscess: Per primary team  Disposition: Per primary team  Antimicrobial agents  See below  Anti-infectives    Start     Dose/Rate Route Frequency Ordered Stop   10/17/15 1415  ertapenem (INVANZ) 1 g in sodium chloride 0.9 % 50 mL IVPB     1 g 100 mL/hr over 30 Minutes Intravenous Every 24 hours 10/17/15 1328     10/15/15 1000  piperacillin-tazobactam (ZOSYN) IVPB 3.375 g  Status:  Discontinued     3.375 g 12.5 mL/hr over 240 Minutes Intravenous Every 8 hours 10/15/15 0846 10/17/15 1328   10/15/15 0845  ciprofloxacin (CIPRO) IVPB 400 mg  Status:  Discontinued     400 mg 200 mL/hr over 60 Minutes Intravenous Every 12 hours 10/15/15 0831 10/15/15 0845   10/15/15 0845  metroNIDAZOLE (FLAGYL) IVPB 500 mg  Status:  Discontinued     500 mg 100 mL/hr over 60 Minutes Intravenous Every 8 hours 10/15/15 0831 10/15/15 0845   10/15/15 0630  ciprofloxacin (CIPRO) IVPB 400 mg     400 mg 200 mL/hr over 60 Minutes Intravenous  Once 10/15/15 0625 10/15/15 0916   10/15/15 0630  metroNIDAZOLE (FLAGYL) IVPB 500 mg     500 mg 100 mL/hr over 60 Minutes  Intravenous  Once 10/15/15 0454 10/15/15 0916     Time spent 20 minutes-Greater than 50% of this time was spent in counseling, explanation of diagnosis, planning of further management, and coordination of care.  MEDICATIONS: Scheduled Meds: . alum & mag hydroxide-simeth  30 mL Oral Once  . enoxaparin (LOVENOX) injection  40 mg Subcutaneous Q24H  . ertapenem  1 g Intravenous Q24H  . labetalol  300 mg Oral BID  . lisinopril  40 mg Oral Daily  . pantoprazole (PROTONIX) IV  40 mg Intravenous Q24H  . saccharomyces boulardii  250 mg Oral BID   Continuous Infusions: . 0.9 % NaCl with KCl 40 mEq / L 50 mL/hr (10/18/15 1454)   PRN Meds:.acetaminophen **OR** acetaminophen, diphenhydrAMINE **OR** diphenhydrAMINE, gi cocktail, metoprolol, ondansetron **OR** ondansetron (ZOFRAN) IV, oxyCODONE-acetaminophen, prochlorperazine, promethazine    PHYSICAL EXAM: Vital signs in last 24 hours: Filed Vitals:   10/18/15 2143 10/19/15 0048 10/19/15 0509 10/19/15 0635  BP: 177/78 164/74 183/84 162/70  Pulse: 75  74   Temp: 98.8 F (37.1 C)  98.2 F (36.8 C)   TempSrc: Oral  Oral   Resp: 20  20   Height:      Weight:      SpO2: 95%  99%     Weight change:  Filed Weights   10/15/15 1712  Weight: 129.457 kg (285 lb 6.4 oz)   Body mass index is 46.09 kg/(m^2).   Gen  Exam: Awake and alert with clear speech.   Neck: Supple, No JVD.   Chest: B/L Clear.   CVS: S1 S2 Regular, no murmurs.  Abdomen: soft, BS +, not tender in LLQ, non distended.  Extremities: no edema, lower extremities warm to touch. Neurologic: Non Focal.   Skin: No Rash.   Wounds: N/A.    Intake/Output from previous day:  Intake/Output Summary (Last 24 hours) at 10/19/15 1151 Last data filed at 10/19/15 0600  Gross per 24 hour  Intake 1858.33 ml  Output   2150 ml  Net -291.67 ml     LAB RESULTS: CBC  Recent Labs Lab 10/14/15 2104 10/16/15 0614 10/17/15 0346  WBC 19.6* 13.4* 9.6  HGB 8.8* 8.4* 8.5*  HCT 28.3*  26.4* 27.7*  PLT 321 322 349  MCV 64.0* 65.5* 64.9*  MCH 19.9* 20.8* 19.9*  MCHC 31.1 31.8 30.7  RDW 21.1* 21.6* 21.6*    Chemistries   Recent Labs Lab 10/14/15 2104 10/16/15 0614 10/16/15 1616 10/17/15 0346  NA 135 140  --  139  K 3.0* 3.5  --  4.0  CL 99* 106  --  108  CO2 22 22  --  22  GLUCOSE 130* 105*  --  100*  BUN 8 5*  --  <5*  CREATININE 0.99 0.89  --  0.94  CALCIUM 9.3 8.7*  --  8.9  MG  --   --  2.0  --     CBG: No results for input(s): GLUCAP in the last 168 hours.  GFR Estimated Creatinine Clearance: 104.3 mL/min (by C-G formula based on Cr of 0.94).  Coagulation profile  Recent Labs Lab 10/15/15 1017  INR 1.36    Cardiac Enzymes  Recent Labs Lab 10/16/15 1515 10/16/15 2120 10/17/15 0346  TROPONINI <0.03 <0.03 <0.03    Invalid input(s): POCBNP No results for input(s): DDIMER in the last 72 hours. No results for input(s): HGBA1C in the last 72 hours.  Recent Labs  10/16/15 1515  CHOL 159  HDL 35*  LDLCALC 106*  TRIG 88  CHOLHDL 4.5   No results for input(s): TSH, T4TOTAL, T3FREE, THYROIDAB in the last 72 hours.  Invalid input(s): FREET3  Recent Labs  10/16/15 1616  VITAMINB12 584  FOLATE 22.1  FERRITIN 56  TIBC 302  IRON 18*  RETICCTPCT 0.6   No results for input(s): LIPASE, AMYLASE in the last 72 hours.  Urine Studies No results for input(s): UHGB, CRYS in the last 72 hours.  Invalid input(s): UACOL, UAPR, USPG, UPH, UTP, UGL, UKET, UBIL, UNIT, UROB, ULEU, UEPI, UWBC, URBC, UBAC, CAST, UCOM, BILUA  MICROBIOLOGY: No results found for this or any previous visit (from the past 240 hour(s)).  RADIOLOGY STUDIES/RESULTS: Ct Abdomen Pelvis W Contrast  10/15/2015  CLINICAL DATA:  Subacute onset of generalized abdominal pain and constipation. Lower back pain. Initial encounter. EXAM: CT ABDOMEN AND PELVIS WITH CONTRAST TECHNIQUE: Multidetector CT imaging of the abdomen and pelvis was performed using the standard protocol  following bolus administration of intravenous contrast. CONTRAST:  OMNIPAQUE IOHEXOL 300 MG/ML  SOLN COMPARISON:  None. FINDINGS: The visualized lung bases are clear.  A tiny hiatal hernia is noted. The liver and spleen are unremarkable in appearance. The gallbladder is within normal limits. The pancreas and adrenal glands are unremarkable. The kidneys are unremarkable in appearance. There is no evidence of hydronephrosis. No renal or ureteral stones are seen. No perinephric stranding is appreciated. The small bowel is unremarkable in appearance. The  stomach is within normal limits. No acute vascular abnormalities are seen. There is a 7.6 x 3.2 x 5.6 cm collection of fluid and air noted superior to the mid sigmoid colon, at the lower abdomen. Mild associated peripheral enhancement is seen, compatible with evolving abscess. This appears to be contained within the mesentery. There is associated wall thickening along the mid sigmoid colon, with likely underlying diverticula. Findings are suspicious for contained perforated diverticulitis. Given the location of this collection, it would be difficult to drain percutaneously. Soft tissue inflammation is seen extending superiorly along the mesentery. The appendix is normal in caliber, without evidence of appendicitis. Minimal scattered diverticulosis is noted along the transverse and descending colon. A tiny umbilical hernia is noted, containing only fat. The bladder is mildly distended and grossly remarkable. The uterus is grossly unremarkable in appearance. The ovaries are relatively symmetric. No suspicious adnexal masses are seen. No inguinal lymphadenopathy is seen. No acute osseous abnormalities are identified. IMPRESSION: 1. 7.6 x 3.2 x 5.6 cm collection of fluid and air superior to the mid sigmoid colon, at the lower abdomen. Mild associated peripheral enhancement, compatible with evolving mesenteric abscess. This appears to reflect contained perforated  diverticulitis, given underlying colonic wall thickening and diverticula. Given the location of this collection, it would be difficult to drain percutaneously. 2. Soft tissue inflammation seen extending superiorly along the mesentery. 3. Tiny umbilical hernia, containing only fat. 4. Minimal scattered diverticulosis along the transverse and descending colon. 5. Tiny hiatal hernia noted. These results were called by telephone at the time of interpretation on 10/15/2015 at 6:24 am to Dr. Dione Booze, who verbally acknowledged these results. Electronically Signed   By: Roanna Raider M.D.   On: 10/15/2015 06:30    Jeoffrey Massed, MD  [facility] Pager:[phone]  If 7PM-7AM, please contact night-coverage [email or web address] Password [email or web address] 10/19/2015, 11:51 AM   LOS: 4 days

## 2015-10-20 ENCOUNTER — Encounter: Payer: Self-pay | Admitting: General Surgery

## 2015-10-20 MED ORDER — PANTOPRAZOLE SODIUM 40 MG PO TBEC: 40.0000 mg | DELAYED_RELEASE_TABLET | Freq: Every day | ORAL | Status: DC

## 2015-10-20 MED ORDER — OXYCODONE-ACETAMINOPHEN 5-325 MG PO TABS: 1.0000 | ORAL_TABLET | ORAL | Status: AC | PRN

## 2015-10-20 MED ORDER — POTASSIUM CHLORIDE CRYS ER 20 MEQ PO TBCR: 20.0000 meq | EXTENDED_RELEASE_TABLET | Freq: Every day | ORAL | Status: DC

## 2015-10-20 MED ORDER — CIPROFLOXACIN HCL 500 MG PO TABS: 500.0000 mg | ORAL_TABLET | Freq: Two times a day (BID) | ORAL | Status: DC

## 2015-10-20 MED ORDER — METRONIDAZOLE 500 MG PO TABS: 500.0000 mg | ORAL_TABLET | Freq: Three times a day (TID) | ORAL | Status: DC

## 2015-10-20 MED ORDER — CIPROFLOXACIN HCL 500 MG PO TABS: 500.0000 mg | ORAL_TABLET | Freq: Two times a day (BID) | ORAL | Status: AC

## 2015-10-20 MED ORDER — HYDROCHLOROTHIAZIDE 12.5 MG PO CAPS: 12.5000 mg | ORAL_CAPSULE | Freq: Every day | ORAL | Status: AC

## 2015-10-20 MED ORDER — AMLODIPINE BESYLATE 5 MG PO TABS
5.0000 mg | ORAL_TABLET | Freq: Every day | ORAL | Status: DC
  Administered 2015-10-20: 5 mg via ORAL
  Filled 2015-10-20: qty 1

## 2015-10-20 MED ORDER — LISINOPRIL 40 MG PO TABS: 40.0000 mg | ORAL_TABLET | Freq: Every day | ORAL | Status: AC

## 2015-10-20 MED ORDER — METRONIDAZOLE 500 MG PO TABS: 500.0000 mg | ORAL_TABLET | Freq: Three times a day (TID) | ORAL | Status: AC

## 2015-10-20 MED ORDER — POTASSIUM CHLORIDE CRYS ER 20 MEQ PO TBCR: 20.0000 meq | EXTENDED_RELEASE_TABLET | Freq: Every day | ORAL | Status: AC

## 2015-10-20 MED ORDER — HYDROCHLOROTHIAZIDE 12.5 MG PO CAPS: 12.5000 mg | ORAL_CAPSULE | Freq: Every day | ORAL | Status: DC

## 2015-10-20 MED ORDER — ACETAMINOPHEN 325 MG PO TABS: 650.0000 mg | ORAL_TABLET | Freq: Four times a day (QID) | ORAL | Status: AC | PRN

## 2015-10-20 MED ORDER — LABETALOL HCL 300 MG PO TABS: 300.0000 mg | ORAL_TABLET | Freq: Two times a day (BID) | ORAL | Status: AC

## 2015-10-20 MED ORDER — SACCHAROMYCES BOULARDII 250 MG PO CAPS: 250.0000 mg | ORAL_CAPSULE | Freq: Two times a day (BID) | ORAL | Status: AC

## 2015-10-20 NOTE — Progress Notes (Signed)
Bethany Houston to be D/C'd  per MD order. Discussed with the patient and all questions fully answered.  VSS, Skin clean, dry and intact without evidence of skin break down, no evidence of skin tears noted.  IV catheter discontinued intact. Site without signs and symptoms of complications. Dressing and pressure applied.  An After Visit Summary was printed and given to the patient. Patient received prescription.  D/c education completed with patient/family including follow up instructions, medication list, d/c activities limitations if indicated, with other d/c instructions as indicated by MD - patient able to verbalize understanding, all questions fully answered.   Patient instructed to return to ED, call 911, or call MD for any changes in condition.   Patient to be escorted via WC, and D/C home via private auto.

## 2015-10-20 NOTE — Discharge Instructions (Signed)
Diverticulitis °Diverticulitis is inflammation or infection of small pouches in your colon that form when you have a condition called diverticulosis. The pouches in your colon are called diverticula. Your colon, or large intestine, is where water is absorbed and stool is formed. °Complications of diverticulitis can include: °· Bleeding. °· Severe infection. °· Severe pain. °· Perforation of your colon. °· Obstruction of your colon. °CAUSES  °Diverticulitis is caused by bacteria. °Diverticulitis happens when stool becomes trapped in diverticula. This allows bacteria to grow in the diverticula, which can lead to inflammation and infection. °RISK FACTORS °People with diverticulosis are at risk for diverticulitis. Eating a diet that does not include enough fiber from fruits and vegetables may make diverticulitis more likely to develop. °SYMPTOMS  °Symptoms of diverticulitis may include: °· Abdominal pain and tenderness. The pain is normally located on the left side of the abdomen, but may occur in other areas. °· Fever and chills. °· Bloating. °· Cramping. °· Nausea. °· Vomiting. °· Constipation. °· Diarrhea. °· Blood in your stool. °DIAGNOSIS  °Your health care provider will ask you about your medical history and do a physical exam. You may need to have tests done because many medical conditions can cause the same symptoms as diverticulitis. Tests may include: °· Blood tests. °· Urine tests. °· Imaging tests of the abdomen, including X-rays and CT scans. °When your condition is under control, your health care provider may recommend that you have a colonoscopy. A colonoscopy can show how severe your diverticula are and whether something else is causing your symptoms. °TREATMENT  °Most cases of diverticulitis are mild and can be treated at home. Treatment may include: °· Taking over-the-counter pain medicines. °· Following a clear liquid diet. °· Taking antibiotic medicines by mouth for 7-10 days. °More severe cases may  be treated at a hospital. Treatment may include: °· Not eating or drinking. °· Taking prescription pain medicine. °· Receiving antibiotic medicines through an IV tube. °· Receiving fluids and nutrition through an IV tube. °· Surgery. °HOME CARE INSTRUCTIONS  °· Follow your health care provider's instructions carefully. °· Follow a full liquid diet or other diet as directed by your health care provider. After your symptoms improve, your health care provider may tell you to change your diet. He or she may recommend you eat a high-fiber diet. Fruits and vegetables are good sources of fiber. Fiber makes it easier to pass stool. °· Take fiber supplements or probiotics as directed by your health care provider. °· Only take medicines as directed by your health care provider. °· Keep all your follow-up appointments. °SEEK MEDICAL CARE IF:  °· Your pain does not improve. °· You have a hard time eating food. °· Your bowel movements do not return to normal. °SEEK IMMEDIATE MEDICAL CARE IF:  °· Your pain becomes worse. °· Your symptoms do not get better. °· Your symptoms suddenly get worse. °· You have a fever. °· You have repeated vomiting. °· You have bloody or black, tarry stools. °MAKE SURE YOU:  °· Understand these instructions. °· Will watch your condition. °· Will get help right away if you are not doing well or get worse. °  °This information is not intended to replace advice given to you by your health care provider. Make sure you discuss any questions you have with your health care provider. °  °Document Released: 05/20/2005 Document Revised: 08/15/2013 Document Reviewed: 07/05/2013 °Elsevier Interactive Patient Education ©2016 [employer] ° °

## 2015-10-20 NOTE — Discharge Summary (Signed)
Physician Discharge Summary  Patient ID: Bethany Houston MRN: [id number] DOB/AGE: [birth year] 45 y.o.  Admit date: 10/15/2015 Discharge date: 10/20/2015  Admission Diagnoses:  Acute sigmoid diverticulitis with 7.6 x 3.2 x 5.6 cm Phlegmon Hypertension  BMI 63.3 Hypokalemia Anemia  Discharge Diagnoses:  Acute sigmoid diverticulitis with 7.6 x 3.2 x 5.6 cm phlegmon Hypertension- poor control.  Medications changed by Medicine service while here. BMI 63.3 Hypokalemia Anemia  Chest pain - Troponin's are negative, no changes on EKG  Principal Problem:   Chest pain Active Problems:   Diverticulitis of colon with perforation   Hypertension   Obesity (BMI 30-39.9)   Anemia   Hypokalemia   Microcytic anemia   PROCEDURES: None  Hospital Course:  Pt presents with constipation that started last week, and then abdominal pain. Laxative gave some relief, but 3 days ago had pain returned. This was across her lower abdomen, and goes to her back. Worse standing and pressure lower abdomen when lying down. She has also had some nausea and vomiting on 2/19 & 2/20. Pain was not getting better said it was like labor pain coming and going with no resolution. Some relief after BM's. She normally does not have issue with constipation.  Work up in the ED shows: admit temp 100.4, VSS.K+ 3.0, Glucose 130, WBC 19.6, and anemia with H/H 89.8/28, hematuria, with hx of recent heavy periods. CT scan shows: 7.6 x 3.2 x 5.6 cm collection of fluid and air superior to the mid sigmoid colon, at the lower abdomen. Mild associated peripheral enhancement, compatible with evolving mesenteric abscess. This appears to reflect contained perforated diverticulitis, given underlying colonic wall thickening and diverticula. Given the location of this collection, it would be difficult to drain Percutaneously. Soft tissue inflammation seen extending superiorly along the mesentery. Tiny umbilical hernia, containing  only fat. Minimal scattered diverticulosis along the transverse and descending colon. Tiny hiatal hernia noted.  She was admitted and placed on Zosyn, she made improvement, but has some almost constant nausea with the Zosyn.  She was changed to Invanz,and nausea resolved.  She had some chest pain and poorly controlled hypertension.  She was seen by Medicine for this.  Chest pain resolved with GI cocktail, troponin's were normal.  EKG was also normal.  Echo shows preserved ejection fraction without any wall motion abnormality.  Hypertension was treated with increased doses of Normodyne and then Lisinopril and hydrochlorothiazide was added.  Her symptoms have resolved by 2/26, and we will send her home with 2 more weeks of Cipro and Flagyl.  Follow up with PCP and Dr. Janee Morn in 2 weeks.  She is to keep a record of her blood pressure at home.  We have deferred follow up imaging until she is seen by Dr. Janee Morn.    Condition on d/c:  Improved     Disposition:      Medication List    TAKE these medications        acetaminophen 325 MG tablet  Commonly known as:  TYLENOL  Take 2 tablets (650 mg total) by mouth every 6 (six) hours as needed for mild pain (or temp > 100).     ciprofloxacin 500 MG tablet  Commonly known as:  CIPRO  Take 1 tablet (500 mg total) by mouth 2 (two) times daily.     hydrochlorothiazide 12.5 MG capsule  Commonly known as:  MICROZIDE  Take 1 capsule (12.5 mg total) by mouth daily.     labetalol 300 MG tablet  Commonly  known as:  NORMODYNE  Take 1 tablet (300 mg total) by mouth 2 (two) times daily.     lisinopril 40 MG tablet  Commonly known as:  PRINIVIL,ZESTRIL  Take 1 tablet (40 mg total) by mouth daily.     metroNIDAZOLE 500 MG tablet  Commonly known as:  FLAGYL  Take 1 tablet (500 mg total) by mouth every 8 (eight) hours.     oxyCODONE-acetaminophen 5-325 MG tablet  Commonly known as:  PERCOCET/ROXICET  Take 1-2 tablets by mouth every 4 (four) hours  as needed for moderate pain.     potassium chloride SA 20 MEQ tablet  Commonly known as:  K-DUR,KLOR-CON  Take 1 tablet (20 mEq total) by mouth daily.     saccharomyces boulardii 250 MG capsule  Commonly known as:  FLORASTOR  Take 1 capsule (250 mg total) by mouth 2 (two) times daily.           Follow-up Information    Follow up with Liz Malady, MD.   Specialty:  General Surgery   Why:  CAll and make an appointment for 2 weeks with Dr. Janee Morn.  Call sooner if there is an issue.   Contact information:   [address] [place] [address] [phone]       Follow up with Call your primary care and get a follow up appointment.   Why:  Call and take new medicines for your blood pressure prescribed here and let them readjust as needed.  You will need to be set up for colonoscopy in the near future also. Keep a record of your blood pressure at home and take with you to see your Primary.      SignedSherrie George 10/20/2015, 11:34 AM

## 2015-10-20 NOTE — Progress Notes (Signed)
PATIENT DETAILS Name: Bethany Houston Age: 45 y.o. Sex: female Date of Birth: 12-21-1970 Admit Date: 10/15/2015 Admitting Physician Ozella Rocks, MD PCP:No primary care provider on file.  Subjective: No further chest pain orl left lower quadrant pain.Wants to go home  Assessment/Plan: Chest pain: Resolved Likely atypical-suspect GI etiology. EKG/troponins negative. Echo shows preserved ejection fraction without any wall motion abnormality- doubt if any inpatient workup is needed. Recommend to continue PPI on discharge.  Active Problems: Hypertension: suspect worsened by left lower quadrant pain/IV fluids. Continue labetalol to 300 mg twice a day,lisinopril to 40 mg-add HCTZ 12.5 mg daily with 20 mEq of KCL. Has a PCP [facility]) to follow up with for further optimization. Ok to discharge from my point of view  Microcytic anemia: Iron panel surprisingly shows a normal ferritin level. Hemoglobin is stable, suspect this is stable for outpatient monitoring.   Diverticular abscess: Per primary team  Disposition: Per primary team  Antimicrobial agents  See below  Anti-infectives    Start     Dose/Rate Route Frequency Ordered Stop   10/17/15 1415  ertapenem (INVANZ) 1 g in sodium chloride 0.9 % 50 mL IVPB     1 g 100 mL/hr over 30 Minutes Intravenous Every 24 hours 10/17/15 1328     10/15/15 1000  piperacillin-tazobactam (ZOSYN) IVPB 3.375 g  Status:  Discontinued     3.375 g 12.5 mL/hr over 240 Minutes Intravenous Every 8 hours 10/15/15 0846 10/17/15 1328   10/15/15 0845  ciprofloxacin (CIPRO) IVPB 400 mg  Status:  Discontinued     400 mg 200 mL/hr over 60 Minutes Intravenous Every 12 hours 10/15/15 0831 10/15/15 0845   10/15/15 0845  metroNIDAZOLE (FLAGYL) IVPB 500 mg  Status:  Discontinued     500 mg 100 mL/hr over 60 Minutes Intravenous Every 8 hours 10/15/15 0831 10/15/15 0845   10/15/15 0630  ciprofloxacin (CIPRO) IVPB 400 mg     400  mg 200 mL/hr over 60 Minutes Intravenous  Once 10/15/15 0625 10/15/15 0916   10/15/15 0630  metroNIDAZOLE (FLAGYL) IVPB 500 mg     500 mg 100 mL/hr over 60 Minutes Intravenous  Once 10/15/15 1610 10/15/15 0916     Time spent 20 minutes-Greater than 50% of this time was spent in counseling, explanation of diagnosis, planning of further management, and coordination of care.  MEDICATIONS: Scheduled Meds: . alum & mag hydroxide-simeth  30 mL Oral Once  . enoxaparin (LOVENOX) injection  40 mg Subcutaneous Q24H  . ertapenem  1 g Intravenous Q24H  . hydrochlorothiazide  12.5 mg Oral Daily  . labetalol  300 mg Oral BID  . lisinopril  40 mg Oral Daily  . [START ON 10/21/2015] pantoprazole  40 mg Oral Daily  . potassium chloride  20 mEq Oral Daily  . saccharomyces boulardii  250 mg Oral BID   Continuous Infusions: . 0.9 % NaCl with KCl 40 mEq / L 50 mL/hr (10/20/15 0628)   PRN Meds:.acetaminophen **OR** acetaminophen, diphenhydrAMINE **OR** diphenhydrAMINE, gi cocktail, metoprolol, ondansetron **OR** ondansetron (ZOFRAN) IV, oxyCODONE-acetaminophen, prochlorperazine, promethazine    PHYSICAL EXAM: Vital signs in last 24 hours: Filed Vitals:   10/19/15 1431 10/19/15 2119 10/20/15 0100 10/20/15 0544  BP: 157/87 177/86 171/74 178/89  Pulse: 78 85  72  Temp: 97.9 F (36.6 C) 98.5 F (36.9 C)  98.2 F (36.8 C)  TempSrc: Oral Oral  Oral  Resp:  Height:      Weight:      SpO2: 100% 100%  98%    Weight change:  Filed Weights   10/15/15 1712  Weight: 129.457 kg (285 lb 6.4 oz)   Body mass index is 46.09 kg/(m^2).   Gen Exam: Awake and alert with clear speech.   Neck: Supple, No JVD.   Chest: B/L Clear.   CVS: S1 S2 Regular, no murmurs.  Abdomen: soft, BS +, not tender in LLQ, non distended.  Extremities: no edema, lower extremities warm to touch. Neurologic: Non Focal.   Skin: No Rash.   Wounds: N/A.    Intake/Output from previous day:  Intake/Output Summary  (Last 24 hours) at 10/20/15 1047 Last data filed at 10/20/15 1022  Gross per 24 hour  Intake   1780 ml  Output    300 ml  Net   1480 ml     LAB RESULTS: CBC  Recent Labs Lab 10/14/15 2104 10/16/15 0614 10/17/15 0346  WBC 19.6* 13.4* 9.6  HGB 8.8* 8.4* 8.5*  HCT 28.3* 26.4* 27.7*  PLT 321 322 349  MCV 64.0* 65.5* 64.9*  MCH 19.9* 20.8* 19.9*  MCHC 31.1 31.8 30.7  RDW 21.1* 21.6* 21.6*    Chemistries   Recent Labs Lab 10/14/15 2104 10/16/15 0614 10/16/15 1616 10/17/15 0346  NA 135 140  --  139  K 3.0* 3.5  --  4.0  CL 99* 106  --  108  CO2 22 22  --  22  GLUCOSE 130* 105*  --  100*  BUN 8 5*  --  <5*  CREATININE 0.99 0.89  --  0.94  CALCIUM 9.3 8.7*  --  8.9  MG  --   --  2.0  --     CBG: No results for input(s): GLUCAP in the last 168 hours.  GFR Estimated Creatinine Clearance: 104.3 mL/min (by C-G formula based on Cr of 0.94).  Coagulation profile  Recent Labs Lab 10/15/15 1017  INR 1.36    Cardiac Enzymes  Recent Labs Lab 10/16/15 1515 10/16/15 2120 10/17/15 0346  TROPONINI <0.03 <0.03 <0.03    Invalid input(s): POCBNP No results for input(s): DDIMER in the last 72 hours. No results for input(s): HGBA1C in the last 72 hours. No results for input(s): CHOL, HDL, LDLCALC, TRIG, CHOLHDL, LDLDIRECT in the last 72 hours. No results for input(s): TSH, T4TOTAL, T3FREE, THYROIDAB in the last 72 hours.  Invalid input(s): FREET3 No results for input(s): VITAMINB12, FOLATE, FERRITIN, TIBC, IRON, RETICCTPCT in the last 72 hours. No results for input(s): LIPASE, AMYLASE in the last 72 hours.  Urine Studies No results for input(s): UHGB, CRYS in the last 72 hours.  Invalid input(s): UACOL, UAPR, USPG, UPH, UTP, UGL, UKET, UBIL, UNIT, UROB, ULEU, UEPI, UWBC, URBC, UBAC, CAST, UCOM, BILUA  MICROBIOLOGY: No results found for this or any previous visit (from the past 240 hour(s)).  RADIOLOGY STUDIES/RESULTS: Ct Abdomen Pelvis W  Contrast  10/15/2015  CLINICAL DATA:  Subacute onset of generalized abdominal pain and constipation. Lower back pain. Initial encounter. EXAM: CT ABDOMEN AND PELVIS WITH CONTRAST TECHNIQUE: Multidetector CT imaging of the abdomen and pelvis was performed using the standard protocol following bolus administration of intravenous contrast. CONTRAST:  OMNIPAQUE IOHEXOL 300 MG/ML  SOLN COMPARISON:  None. FINDINGS: The visualized lung bases are clear.  A tiny hiatal hernia is noted. The liver and spleen are unremarkable in appearance. The gallbladder is within normal limits.  The pancreas and adrenal glands are unremarkable. The kidneys are unremarkable in appearance. There is no evidence of hydronephrosis. No renal or ureteral stones are seen. No perinephric stranding is appreciated. The small bowel is unremarkable in appearance. The stomach is within normal limits. No acute vascular abnormalities are seen. There is a 7.6 x 3.2 x 5.6 cm collection of fluid and air noted superior to the mid sigmoid colon, at the lower abdomen. Mild associated peripheral enhancement is seen, compatible with evolving abscess. This appears to be contained within the mesentery. There is associated wall thickening along the mid sigmoid colon, with likely underlying diverticula. Findings are suspicious for contained perforated diverticulitis. Given the location of this collection, it would be difficult to drain percutaneously. Soft tissue inflammation is seen extending superiorly along the mesentery. The appendix is normal in caliber, without evidence of appendicitis. Minimal scattered diverticulosis is noted along the transverse and descending colon. A tiny umbilical hernia is noted, containing only fat. The bladder is mildly distended and grossly remarkable. The uterus is grossly unremarkable in appearance. The ovaries are relatively symmetric. No suspicious adnexal masses are seen. No inguinal lymphadenopathy is seen. No acute osseous  abnormalities are identified. IMPRESSION: 1. 7.6 x 3.2 x 5.6 cm collection of fluid and air superior to the mid sigmoid colon, at the lower abdomen. Mild associated peripheral enhancement, compatible with evolving mesenteric abscess. This appears to reflect contained perforated diverticulitis, given underlying colonic wall thickening and diverticula. Given the location of this collection, it would be difficult to drain percutaneously. 2. Soft tissue inflammation seen extending superiorly along the mesentery. 3. Tiny umbilical hernia, containing only fat. 4. Minimal scattered diverticulosis along the transverse and descending colon. 5. Tiny hiatal hernia noted. These results were called by telephone at the time of interpretation on 10/15/2015 at 6:24 am to Dr. Dione Booze, who verbally acknowledged these results. Electronically Signed   By: Roanna Raider M.D.   On: 10/15/2015 06:30    Jeoffrey Massed, MD  [facility] Pager:[phone]  If 7PM-7AM, please contact night-coverage [email or web address] Password [facility] 10/20/2015, 10:47 AM   LOS: 5 days

## 2015-10-20 NOTE — Progress Notes (Signed)
  Subjective: She feel good, BP better.  She would like to go home if she can.  Objective: Vital signs in last 24 hours: Temp:  [97.9 F (36.6 C)-98.5 F (36.9 C)] 98.2 F (36.8 C) (02/26 0544) Pulse Rate:  [72-85] 72 (02/26 0544) Resp:  [16] 16 (02/26 0544) BP: (157-178)/(74-89) 178/89 mmHg (02/26 0544) SpO2:  [98 %-100 %] 98 % (02/26 0544) Last BM Date: 10/17/15 1030 PO  Cardiac diet Afebrile, BP still up some, Medicine is adjusting her BP Medicines No labs Intake/Output from previous day: 02/25 0701 - 02/26 0700 In: 1480 [P.O.:1030; I.V.:450] Out: 300 [Urine:300] Intake/Output this shift:    General appearance: alert, cooperative and no distress GI: soft, non-tender; bowel sounds normal; no masses,  no organomegaly  Lab Results:  No results for input(s): WBC, HGB, HCT, PLT in the last 72 hours.  BMET No results for input(s): NA, K, CL, CO2, GLUCOSE, BUN, CREATININE, CALCIUM in the last 72 hours. PT/INR No results for input(s): LABPROT, INR in the last 72 hours.   Recent Labs Lab 10/14/15 2104  AST 15  ALT 13*  ALKPHOS 88  BILITOT 0.5  PROT 7.5  ALBUMIN 3.1*     Lipase     Component Value Date/Time   LIPASE 38 10/14/2015 2104     Studies/Results: No results found.  Medications: . alum & mag hydroxide-simeth  30 mL Oral Once  . amLODipine  5 mg Oral Daily  . enoxaparin (LOVENOX) injection  40 mg Subcutaneous Q24H  . ertapenem  1 g Intravenous Q24H  . labetalol  300 mg Oral BID  . lisinopril  40 mg Oral Daily  . pantoprazole (PROTONIX) IV  40 mg Intravenous Q24H  . saccharomyces boulardii  250 mg Oral BID    Assessment/Plan Acute sigmoid diverticulitis with 7.6 x 3.2 x 5.6 cm abscess Hypertension  BMI 63.3 Hypokalemia Anemia  Chest pain - Troponin's are negative, no changes on EKG Antibiotics: Day 3 Zosyn changed to Invanz 10/17/15 Day 4 Invanz DVT: Lovenox/SCD    Plan:  See if we can switch her to PO antibiotics and let her go  home and follow up as OP.  Will discuss with Dr. Donell Beers.  LOS: 5 days    Bethany Houston 10/20/2015

## 2015-11-07 ENCOUNTER — Other Ambulatory Visit: Payer: Self-pay | Admitting: General Surgery

## 2015-11-07 DIAGNOSIS — K5780 Diverticulitis of intestine, part unspecified, with perforation and abscess without bleeding: Secondary | ICD-10-CM

## 2015-11-11 ENCOUNTER — Encounter: Payer: Self-pay | Admitting: Gastroenterology

## 2015-11-14 ENCOUNTER — Other Ambulatory Visit: Payer: Medicaid Other

## 2015-11-21 ENCOUNTER — Ambulatory Visit
Admission: RE | Admit: 2015-11-21 | Discharge: 2015-11-21 | Disposition: A | Discharge status: Home / Self Care | Payer: Medicaid Other | Source: Ambulatory Visit | Attending: General Surgery | Admitting: General Surgery

## 2015-11-21 DIAGNOSIS — K5780 Diverticulitis of intestine, part unspecified, with perforation and abscess without bleeding: Secondary | ICD-10-CM

## 2015-11-21 MED ORDER — IOPAMIDOL (ISOVUE-300) INJECTION 61%
100.0000 mL | Freq: Once | INTRAVENOUS | Status: AC | PRN
  Administered 2015-11-21: 100 mL via INTRAVENOUS

## 2015-11-26 ENCOUNTER — Other Ambulatory Visit: Payer: Self-pay | Admitting: General Surgery

## 2015-11-26 DIAGNOSIS — Z1231 Encounter for screening mammogram for malignant neoplasm of breast: Secondary | ICD-10-CM

## 2015-12-17 ENCOUNTER — Ambulatory Visit
Admission: RE | Admit: 2015-12-17 | Discharge: 2015-12-17 | Disposition: A | Discharge status: Home / Self Care | Payer: Medicaid Other | Source: Ambulatory Visit | Attending: General Surgery | Admitting: General Surgery

## 2015-12-17 DIAGNOSIS — Z1231 Encounter for screening mammogram for malignant neoplasm of breast: Secondary | ICD-10-CM

## 2015-12-23 ENCOUNTER — Other Ambulatory Visit: Payer: Self-pay | Admitting: General Surgery

## 2015-12-23 DIAGNOSIS — R928 Other abnormal and inconclusive findings on diagnostic imaging of breast: Secondary | ICD-10-CM

## 2015-12-27 ENCOUNTER — Encounter: Payer: Self-pay | Admitting: *Deleted

## 2015-12-30 ENCOUNTER — Ambulatory Visit
Admission: RE | Admit: 2015-12-30 | Discharge: 2015-12-30 | Disposition: A | Discharge status: Home / Self Care | Payer: Medicaid Other | Source: Ambulatory Visit | Attending: General Surgery | Admitting: General Surgery

## 2015-12-30 DIAGNOSIS — R928 Other abnormal and inconclusive findings on diagnostic imaging of breast: Secondary | ICD-10-CM

## 2016-01-03 ENCOUNTER — Ambulatory Visit: Payer: Medicaid Other | Admitting: Gastroenterology

## 2016-04-04 IMAGING — CT CT ABD-PELV W/ CM
2 of 5 series · 15 of 46 positions shown, 17 images · IV contrast (iopamidol)
Comparison: CT abdomen pelvis 10/15/2015

CLINICAL DATA: Patient with history of diverticulitis and abscess.
Follow-up evaluation.

EXAM:
CT ABDOMEN AND PELVIS WITH CONTRAST
TECHNIQUE: Multidetector CT imaging of the abdomen and pelvis was performed
using the standard protocol following bolus administration of
intravenous contrast.
CONTRAST:  100mL 4AQ3FV-PFF IOPAMIDOL (4AQ3FV-PFF) INJECTION 61%

[Series 2: abd/pelvis w/cm · axial · 0.73mm/px · z∈[+641,+1046]mm · 12 of 91 slices shown, 14 images]
[im 5/91  soft-tissue]
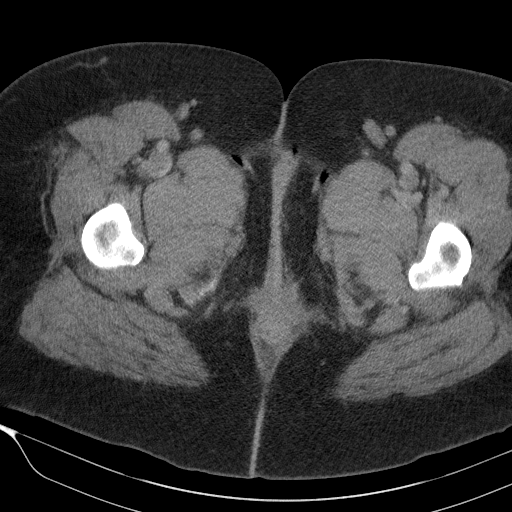
[im 5/91  bone]
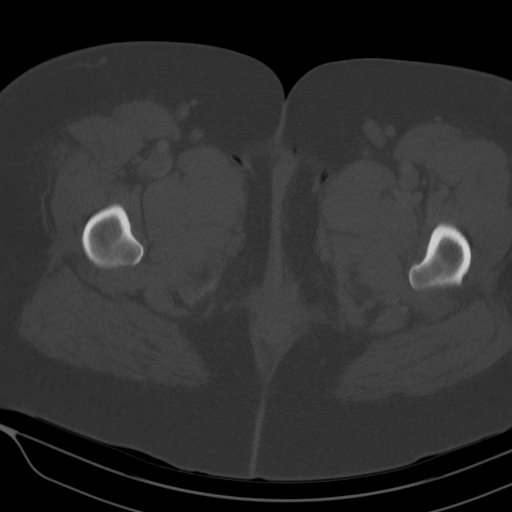
[im 14/91  soft-tissue]
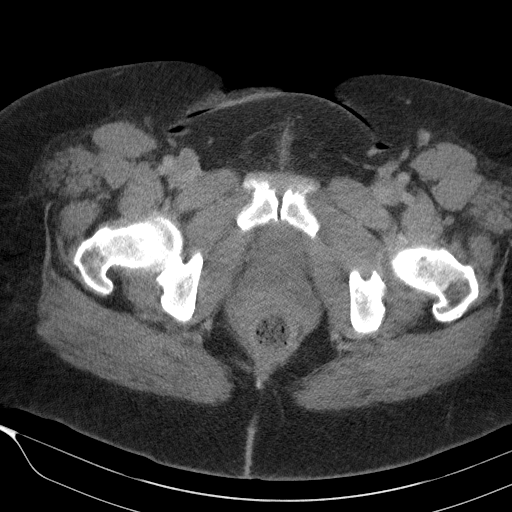
[im 19/91  soft-tissue]
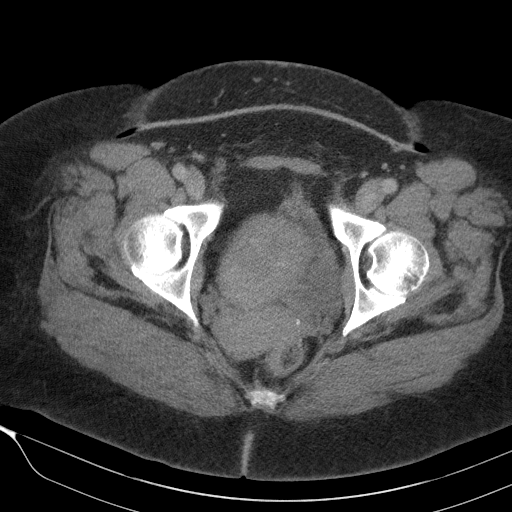
[im 28/91  soft-tissue]
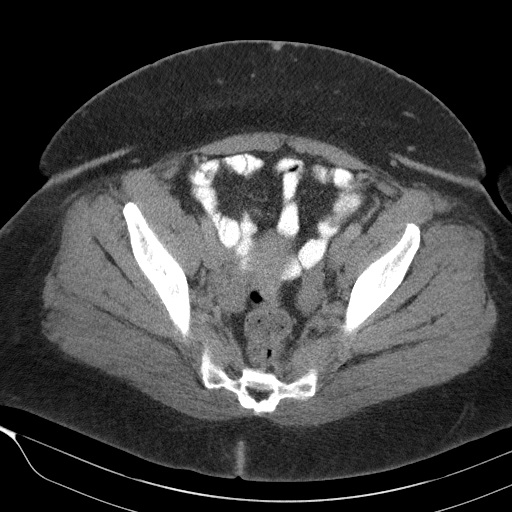
[im 37/91  soft-tissue]
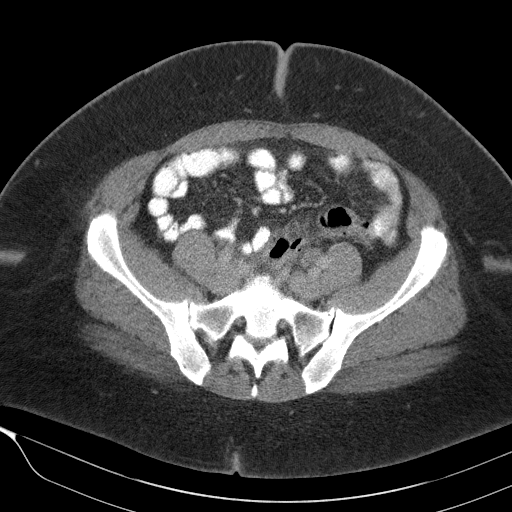
[im 41/91  soft-tissue]
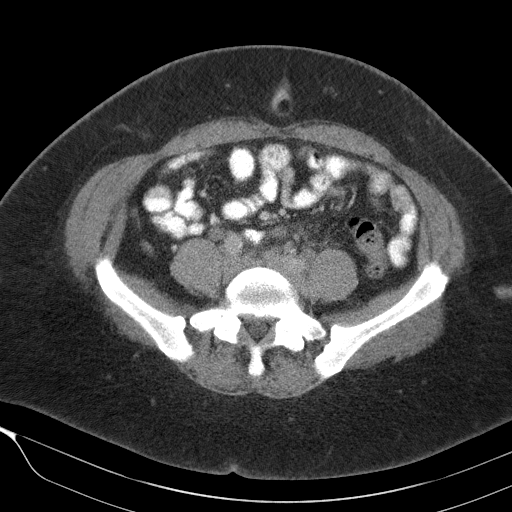
[im 50/91  soft-tissue]
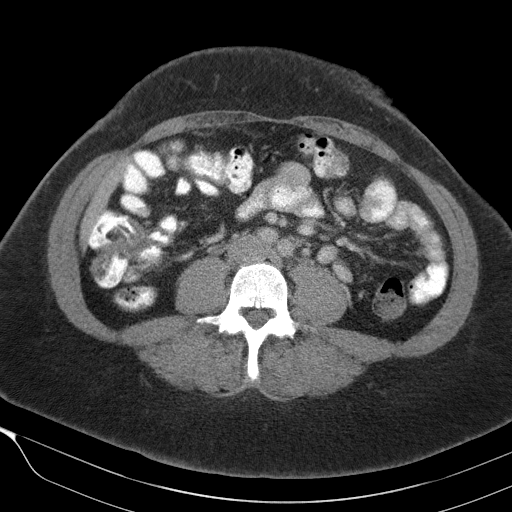
[im 55/91  soft-tissue]
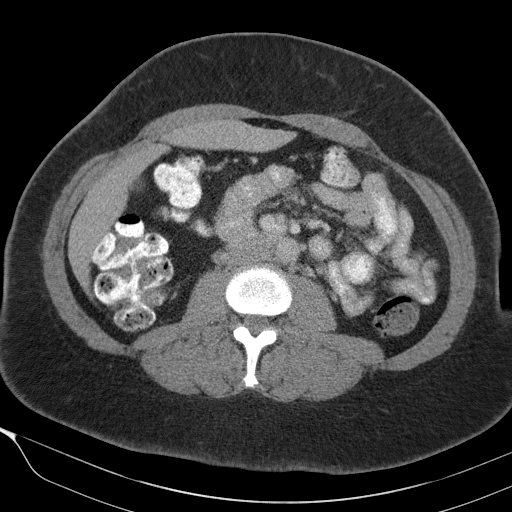
[im 64/91  soft-tissue]
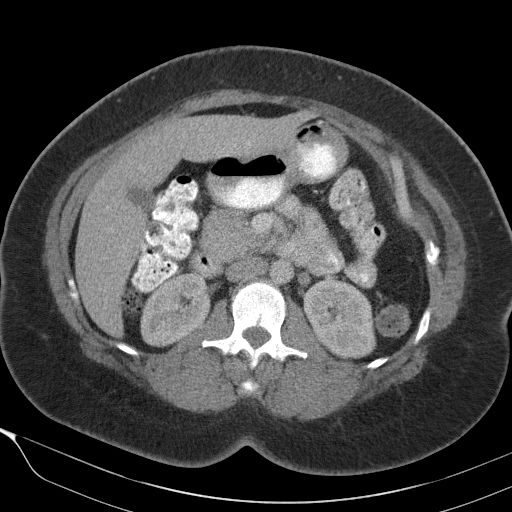
[im 64/91  bone]
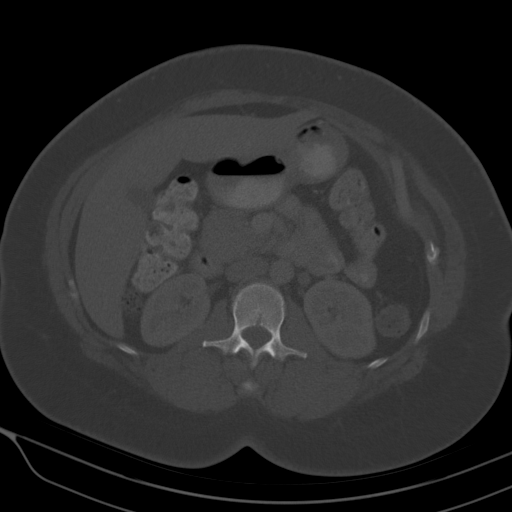
[im 73/91  soft-tissue]
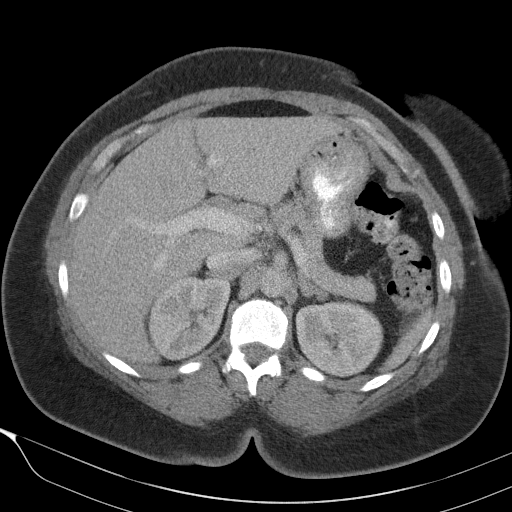
[im 77/91  soft-tissue]
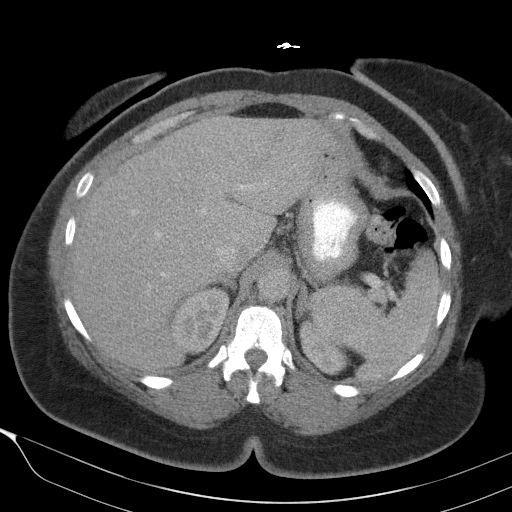
[im 86/91  soft-tissue]
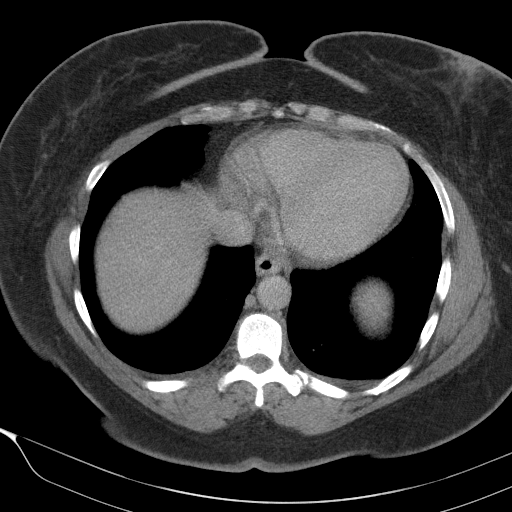

[Series 3: cor · coronal · 0.71mm/px · 3 of 99 slices shown]
[im 33/99  soft-tissue]
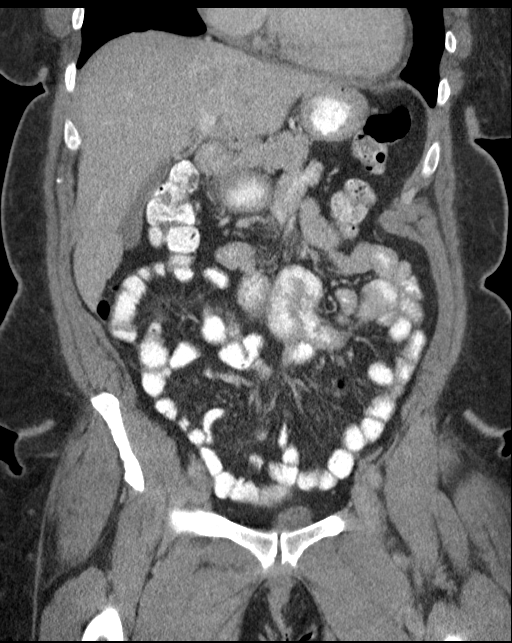
[im 44/99  soft-tissue]
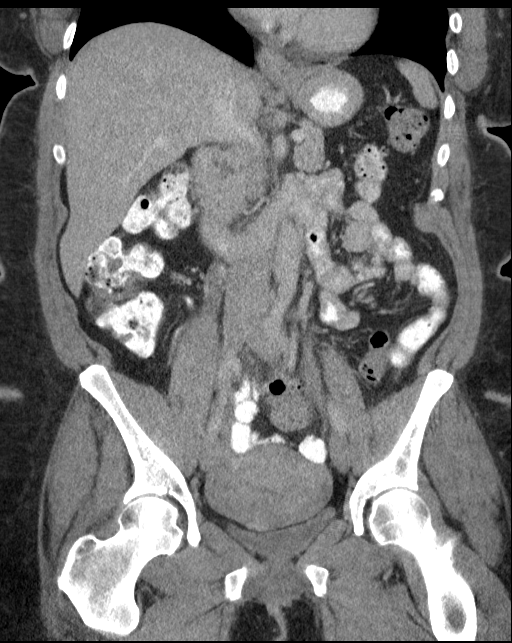
[im 55/99  soft-tissue]
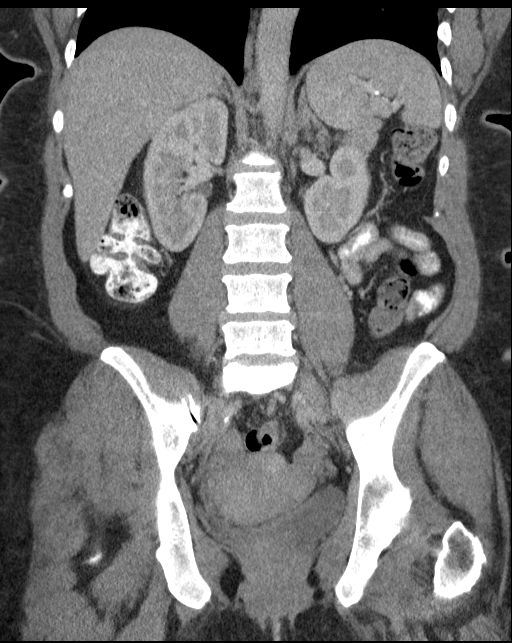

[15 of 46 positions shown; findings below may reference images not displayed]

FINDINGS: Lower chest: Abnormal soft tissue thickening within the inferior
retroareolar left breast (image 5; series 2). Normal heart size.
Minimal atelectasis left lung base.

Hepatobiliary: Liver is normal in size and contour. No focal hepatic
lesions identified. Gallbladder is unremarkable. No intrahepatic or
extrahepatic biliary ductal dilatation.

Pancreas: Unremarkable

Spleen: Unremarkable

Adrenals/Urinary Tract: Mild thickening of the adrenal glands
bilaterally. Kidneys enhance symmetrically with contrast. No
hydronephrosis. Urinary bladder is unremarkable.

Stomach/Bowel: Significant interval improvement in previously
described air and fluid collection superior to the sigmoid colon.
There is near complete resolution of associated inflammatory change.
There is a residual small gas and fluid collection measuring
approximately 2.6 x 2.4 cm. This appears to communicate with the
lumen of the colon. Adjacent to this collection is a 12 mm soft
tissue mass (image 52; series 2), potentially changes from prior
healed abscess versus mildly enlarged lymph node. Remainder of the
bowel is unremarkable without evidence for obstruction. The appendix
is normal. Normal morphology to the stomach. Small hiatal hernia.

Vascular/Lymphatic: Normal caliber abdominal aorta. No
retroperitoneal or pelvic adenopathy.

Other: Uterus is enlarged and heterogeneous enhancement. Adnexal
structures are unremarkable.

Musculoskeletal: Periumbilical fat containing hernia. Lumbar spine
degenerative changes. No aggressive or acute appearing osseous
lesions.
IMPRESSION: Significant interval improvement in and decrease in size of
previously described abscess along the superior aspect of the
sigmoid colon. There is a small residual air-fluid collection which
may communicate with the lumen of the sigmoid colon.

Small soft tissue mass adjacent to this residual collection which
may represent sequelae of prior abscess or an inflamed lymph node.

Nonspecific soft tissue thickening within the inferior retroareolar
left breast. Recommend dedicated evaluation with mammography if not
previously performed.

## 2017-04-17 ENCOUNTER — Emergency Department (HOSPITAL_COMMUNITY)
Admission: EM | Admit: 2017-04-17 | Discharge: 2017-04-24 | Disposition: E | Payer: Medicaid Other | Attending: Emergency Medicine | Admitting: Emergency Medicine

## 2017-04-17 DIAGNOSIS — Z79899 Other long term (current) drug therapy: Secondary | ICD-10-CM | POA: Insufficient documentation

## 2017-04-17 DIAGNOSIS — I469 Cardiac arrest, cause unspecified: Secondary | ICD-10-CM | POA: Insufficient documentation

## 2017-04-17 DIAGNOSIS — I10 Essential (primary) hypertension: Secondary | ICD-10-CM | POA: Insufficient documentation

## 2017-04-17 MED ORDER — SODIUM BICARBONATE 8.4 % IV SOLN
INTRAVENOUS | Status: AC | PRN
  Administered 2017-04-17: 100 meq via INTRAVENOUS

## 2017-04-17 MED ORDER — EPINEPHRINE PF 1 MG/10ML IJ SOSY
PREFILLED_SYRINGE | INTRAMUSCULAR | Status: AC | PRN
  Administered 2017-04-17: 1 via INTRAVENOUS

## 2017-04-17 NOTE — Code Documentation (Signed)
Pt to ED by [facility] as a cardiac arrest; Per family, pt was not feeling well at a cookout earlier today. Family helped pt out of bathroom into bedroom, went to get pt's dinner and came back, noticed pt was unresponsive. PT initially PEA at a rate of 50, received epi x9, two defibrillations, and an epi gtt. King airway, IO, and IV in place.

## 2017-04-17 NOTE — Code Documentation (Signed)
Patient time of death occurred at [id number]

## 2017-04-17 NOTE — ED Provider Notes (Signed)
[facility]-EMERGENCY DEPT Provider Note   CSN: [id number] Arrival date & time: 04/05/2017  2314     History   Chief Complaint Chief Complaint  Patient presents with  . Cardiac Arrest   Level V caveat: cardiac arrest, king airway HPI Bethany Houston is a 46 y.o. female.  HPI Pt brought to ER from home after witnessed arrest with bystander CPR. Hx of HTN. Family reports pt had been feeling "weak and dizzy" today. Went to lay down and was found unresponsive for family. PEA on EMS arrival. Blood sugar in the 300s. Kingairway placed, standard ACLS. Epi x 9 given. No other meds. EMS reports occasional ROSC followed by quick return to PEA    Past Medical History:  Diagnosis Date  . Anemia   . Diverticulitis   . Hiatal hernia   . Hypertension   . Hypokalemia   . 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 Morbid obesity [facility])     Patient Active Problem List   Diagnosis Date Noted  . Chest pain 10/16/2015  . Anemia 10/16/2015  . Hypokalemia 10/16/2015  . Hypertension   . Obesity (BMI 30-39.9)   . Microcytic anemia   . Diverticulitis of colon with perforation 10/15/2015    No past surgical history on file.  OB History    No data available       Home Medications    Prior to Admission medications   Medication Sig Start Date End Date Taking? Authorizing Provider  acetaminophen (TYLENOL) 325 MG tablet Take 2 tablets (650 mg total) by mouth every 6 (six) hours as needed for mild pain (or temp > 100). 10/20/15   Sherrie George, PA-C  ciprofloxacin (CIPRO) 500 MG tablet Take 1 tablet (500 mg total) by mouth 2 (two) times daily. 10/20/15   Sherrie George, PA-C  hydrochlorothiazide (MICROZIDE) 12.5 MG capsule Take 1 capsule (12.5 mg total) by mouth daily. 10/20/15   Sherrie George, PA-C  labetalol (NORMODYNE) 300 MG tablet Take 1 tablet (300 mg total) by mouth 2 (two) times daily. 10/20/15   Sherrie George, PA-C  lisinopril (PRINIVIL,ZESTRIL) 40 MG  tablet Take 1 tablet (40 mg total) by mouth daily. 10/20/15   Sherrie George, PA-C  metroNIDAZOLE (FLAGYL) 500 MG tablet Take 1 tablet (500 mg total) by mouth every 8 (eight) hours. 10/20/15   Sherrie George, PA-C  oxyCODONE-acetaminophen (PERCOCET/ROXICET) 5-325 MG tablet Take 1-2 tablets by mouth every 4 (four) hours as needed for moderate pain. 10/20/15   Sherrie George, PA-C  potassium chloride SA (K-DUR,KLOR-CON) 20 MEQ tablet Take 1 tablet (20 mEq total) by mouth daily. 10/20/15   Sherrie George, PA-C  saccharomyces boulardii (FLORASTOR) 250 MG capsule Take 1 capsule (250 mg total) by mouth 2 (two) times daily. 10/20/15   Sherrie George, PA-C    Family History No family history on file.  Social History Social History  Substance Use Topics  . Smoking status: Never Smoker  . Smokeless tobacco: Not on file  . Alcohol use 0.0 oz/week     Comment: occasional     Allergies   Patient has no known allergies.   Review of Systems Review of Systems  Unable to perform ROS: Intubated     Physical Exam Updated Vital Signs BP (!) 0/0   Pulse (!) 0   Resp (!) 0   LMP  (LMP Unknown)   Physical Exam  Constitutional: She appears well-developed and well-nourished.  HENT:  Head: Normocephalic and atraumatic.  Eyes:  Fixed and dilated  Neck: Neck supple.  No crepitus  Cardiovascular:  Pulses present with CPR.  Absent cardiac sounds  Pulmonary/Chest:  Bilateral breath sounds present with bag tube ventilation  Abdominal: She exhibits no distension.  Genitourinary:  Genitourinary Comments: Normal external genitalia  Musculoskeletal: She exhibits no edema or deformity.  No deformity of extremities  Neurological:  GCS 3  Skin: Skin is warm. No rash noted.  Psychiatric:  Unable to test     ED Treatments / Results  Labs (all labs ordered are listed, but only abnormal results are displayed) Labs Reviewed - No data to display  EKG  EKG Interpretation None        Radiology No results found.  Procedures Procedures (including critical care time)      +++++++++++++++++++++++++++++++++++++++++++  Cardiopulmonary Resuscitation (CPR) Procedure Note Directed/Performed by: Lyanne Co I personally directed ancillary staff and/or performed CPR in an effort to regain return of spontaneous circulation and to maintain cardiac, neuro and systemic perfusion.   +++++++++++++++++++++++++++++++++++++++++++++      Medications Ordered in ED Medications  EPINEPHrine (ADRENALIN) 1 MG/10ML injection (1 Syringe Intravenous Given 04-29-17 2319)  sodium bicarbonate injection (100 mEq Intravenous Given 04-29-2017 2318)     Initial Impression / Assessment and Plan / ED Course  I have reviewed the triage vital signs and the nursing notes.  Pertinent labs & imaging results that were available during my care of the patient were reviewed by me and considered in my medical decision making (see chart for details).    90 minutes of downtime. Standard ACLS on arrival. Bicarb x 2 given and epi given. Pulses and asystole after medications. King airway in place. Given prolonged downtime and asystole on arrival code called  Time of Death: 2322pm.   Final Clinical Impressions(s) / ED Diagnoses   Final diagnoses:  None    New Prescriptions New Prescriptions   No medications on file     Azalia Bilis, MD 04/29/2017 2348

## 2017-04-18 MED FILL — Medication: Qty: 1 | Status: AC

## 2017-04-24 NOTE — ED Provider Notes (Signed)
Case discussed with medical examiner. Will be ME case   Bethany Bilis, MD 04/01/2017 [phone]

## 2017-04-24 NOTE — Progress Notes (Signed)
   2017/05/18 0100  Clinical Encounter Type  Visited With Family;Health care provider  Visit Type Initial;ED;Code;Death  Referral From Care management  Stress Factors  Family Stress Factors Loss    Location: ED  Time: 11:54 Ashby Dawes of call: Post CPR / Death  Chaplain responded to a trauma regarding a patient who was brought to ED post-CPR. Patient time of death (which was documented by the RN) was at 23:22. Chaplain took family to the consultation to receive death notice. After notice was given Chaplain collected next of kin's information and gave family a patient placement card which allows family to go home and choose a funereal home or make arrangements when there isn't a plan in place. Family said they would like to see patient and say their farewells, chaplain is on standby and is ready to give emotional and spiritual support when/if needed  Thanks,  [phone]

## 2017-04-24 DEATH — deceased
